# Patient Record
Sex: Female | Born: 1935 | Race: White | Hispanic: No | Smoking: Never smoker
Health system: Southern US, Community
[De-identification: ages and names within clinical notes are randomized; demographics above are authoritative.]

## PROBLEM LIST (undated history)

## (undated) ENCOUNTER — Emergency Department (HOSPITAL_COMMUNITY): Disposition: A | Discharge status: Home / Self Care | Payer: Medicare Other

## (undated) DIAGNOSIS — M4800 Spinal stenosis, site unspecified: Secondary | ICD-10-CM

## (undated) DIAGNOSIS — B029 Zoster without complications: Secondary | ICD-10-CM

## (undated) DIAGNOSIS — F329 Major depressive disorder, single episode, unspecified: Secondary | ICD-10-CM

## (undated) DIAGNOSIS — H9190 Unspecified hearing loss, unspecified ear: Secondary | ICD-10-CM

## (undated) DIAGNOSIS — IMO0001 Reserved for inherently not codable concepts without codable children: Secondary | ICD-10-CM

## (undated) DIAGNOSIS — D649 Anemia, unspecified: Secondary | ICD-10-CM

## (undated) DIAGNOSIS — D126 Benign neoplasm of colon, unspecified: Secondary | ICD-10-CM

## (undated) DIAGNOSIS — R413 Other amnesia: Secondary | ICD-10-CM

## (undated) DIAGNOSIS — M069 Rheumatoid arthritis, unspecified: Secondary | ICD-10-CM

## (undated) DIAGNOSIS — E7800 Pure hypercholesterolemia, unspecified: Secondary | ICD-10-CM

## (undated) DIAGNOSIS — M810 Age-related osteoporosis without current pathological fracture: Secondary | ICD-10-CM

## (undated) DIAGNOSIS — K5792 Diverticulitis of intestine, part unspecified, without perforation or abscess without bleeding: Secondary | ICD-10-CM

## (undated) DIAGNOSIS — I10 Essential (primary) hypertension: Secondary | ICD-10-CM

## (undated) DIAGNOSIS — F32A Depression, unspecified: Secondary | ICD-10-CM

## (undated) HISTORY — PX: VAGINAL HYSTERECTOMY: SUR661

## (undated) HISTORY — PX: ANKLE SURGERY: SHX546

## (undated) HISTORY — PX: BACK SURGERY: SHX140

## (undated) HISTORY — PX: GALLBLADDER SURGERY: SHX652

## (undated) HISTORY — PX: TONSILLECTOMY AND ADENOIDECTOMY: SHX28

---

## 1997-10-06 ENCOUNTER — Other Ambulatory Visit: Admission: RE | Admit: 1997-10-06 | Discharge: 1997-10-06 | Payer: Self-pay

## 1999-01-06 ENCOUNTER — Ambulatory Visit (HOSPITAL_COMMUNITY): Admission: RE | Admit: 1999-01-06 | Discharge: 1999-01-06 | Payer: Self-pay | Admitting: *Deleted

## 1999-02-15 ENCOUNTER — Encounter: Payer: Self-pay | Admitting: *Deleted

## 1999-02-15 ENCOUNTER — Encounter: Admission: RE | Admit: 1999-02-15 | Discharge: 1999-02-15 | Payer: Self-pay | Admitting: *Deleted

## 1999-03-11 ENCOUNTER — Encounter: Payer: Self-pay | Admitting: General Surgery

## 1999-03-11 ENCOUNTER — Encounter: Admission: RE | Admit: 1999-03-11 | Discharge: 1999-03-11 | Payer: Self-pay | Admitting: General Surgery

## 1999-03-22 ENCOUNTER — Encounter: Payer: Self-pay | Admitting: General Surgery

## 1999-03-25 ENCOUNTER — Encounter (INDEPENDENT_AMBULATORY_CARE_PROVIDER_SITE_OTHER): Payer: Self-pay | Admitting: Specialist

## 1999-03-25 ENCOUNTER — Ambulatory Visit (HOSPITAL_COMMUNITY): Admission: RE | Admit: 1999-03-25 | Discharge: 1999-03-26 | Payer: Self-pay | Admitting: General Surgery

## 1999-04-18 ENCOUNTER — Ambulatory Visit (HOSPITAL_COMMUNITY): Admission: RE | Admit: 1999-04-18 | Discharge: 1999-04-18 | Payer: Self-pay | Admitting: *Deleted

## 1999-04-18 ENCOUNTER — Encounter (INDEPENDENT_AMBULATORY_CARE_PROVIDER_SITE_OTHER): Payer: Self-pay | Admitting: *Deleted

## 1999-09-27 ENCOUNTER — Encounter: Payer: Self-pay | Admitting: Family Medicine

## 1999-09-27 ENCOUNTER — Encounter: Admission: RE | Admit: 1999-09-27 | Discharge: 1999-09-27 | Payer: Self-pay | Admitting: Family Medicine

## 2000-01-19 ENCOUNTER — Encounter: Payer: Self-pay | Admitting: Neurosurgery

## 2000-01-19 ENCOUNTER — Ambulatory Visit (HOSPITAL_COMMUNITY): Admission: RE | Admit: 2000-01-19 | Discharge: 2000-01-19 | Payer: Self-pay | Admitting: Neurosurgery

## 2000-02-02 ENCOUNTER — Encounter: Payer: Self-pay | Admitting: Neurosurgery

## 2000-02-02 ENCOUNTER — Ambulatory Visit (HOSPITAL_COMMUNITY): Admission: RE | Admit: 2000-02-02 | Discharge: 2000-02-02 | Payer: Self-pay | Admitting: Neurosurgery

## 2000-04-03 ENCOUNTER — Encounter: Payer: Self-pay | Admitting: Neurosurgery

## 2000-04-03 ENCOUNTER — Ambulatory Visit (HOSPITAL_COMMUNITY): Admission: RE | Admit: 2000-04-03 | Discharge: 2000-04-03 | Payer: Self-pay | Admitting: Neurosurgery

## 2001-03-25 ENCOUNTER — Other Ambulatory Visit: Admission: RE | Admit: 2001-03-25 | Discharge: 2001-03-25 | Payer: Self-pay | Admitting: Family Medicine

## 2002-07-03 ENCOUNTER — Ambulatory Visit (HOSPITAL_COMMUNITY): Admission: RE | Admit: 2002-07-03 | Discharge: 2002-07-03 | Payer: Self-pay | Admitting: *Deleted

## 2006-07-12 ENCOUNTER — Encounter: Admission: RE | Admit: 2006-07-12 | Discharge: 2006-07-12 | Payer: Self-pay | Admitting: Neurosurgery

## 2006-08-03 ENCOUNTER — Inpatient Hospital Stay (HOSPITAL_COMMUNITY): Admission: RE | Admit: 2006-08-03 | Discharge: 2006-08-07 | Payer: Self-pay | Admitting: Neurosurgery

## 2007-10-02 ENCOUNTER — Encounter: Admission: RE | Admit: 2007-10-02 | Discharge: 2007-10-02 | Payer: Self-pay | Admitting: Rheumatology

## 2009-02-21 ENCOUNTER — Emergency Department: Payer: Self-pay | Admitting: Internal Medicine

## 2010-07-05 NOTE — Op Note (Signed)
NAME:  Brittany Solomon, Brittany Solomon                      ACCOUNT NO.:  [id number]   MEDICAL RECORD NO.:  [id number]          PATIENT TYPE:  INP   LOCATION:  3022                         FACILITY:  [facility]   PHYSICIAN:  Hilda Lias, M.D.   DATE OF BIRTH:  January 28, 1926   DATE OF PROCEDURE:  08/03/2006  DATE OF DISCHARGE:                               OPERATIVE REPORT   PREOPERATIVE DIAGNOSIS:  Lumbar stenosis L3-L4 and L4-L5 with step off  at the level L4-L5.   POSTOPERATIVE DIAGNOSIS:  Lumbar stenosis L3-L4 and L4-L5 with step off  at the level L4-L5.   PROCEDURE:  Bilateral L3, L4, and L5 laminectomy, posterolateral  arthrodesis with autograft.   SURGEON:  Hilda Lias, M.D.   CLINICAL HISTORY:  Ms. Veale is a lady who has been seen by me for more  than a year complaining of back pain with radiation to both legs.  We  knew from the beginning that she had a severe stenosis at the level of  L4-L5 and borderline at L3-L4.  The patient wanted to avoid surgery, but  she was getting worse to the point where now she feels that she wants to  proceed with surgery.  Proceeded The surgery was fully explained to her.   DESCRIPTION OF PROCEDURE:  The patient was taken to the OR and she was  positioned in a prone manner.  The back was cleaned with DuraPrep.  A  midline incision from L3 to L5-S1 was made and muscles were retracted  all the way laterally until we saw the beginning of the transverse  process.  From then, on with the Leksell, we removed the spinous process  of L5, L4, and L3, and with the drill, we did a bilateral laminectomy.  The patient had a thick yellow ligament at the level L3-L4 but worse at  the level of L4-L5.  Decompression of the canal was achieved.  Then,  foraminotomy to decompress the L3, L4, L5, and S1 nerve roots was done.  Having a good decompression, the area was irrigated.  We went laterally  and we drilled the lateral aspect of the facet as well as the beginning  of the transverse  process.  From then on, autograft was used for  arthrodesis.  Having good hemostasis, the area was closed with Vicryl  and Steri-Strips.           ______________________________  Hilda Lias, M.D.     EB/MEDQ  D:  08/03/2006  T:  08/04/2006  Job:  [id number]

## 2010-07-05 NOTE — Discharge Summary (Signed)
Brittany Solomon, Brittany Solomon                   ACCOUNT NO.:  [id number]   MEDICAL RECORD NO.:  [id number]          PATIENT TYPE:  INP   LOCATION:  3022                         FACILITY:  [facility]   PHYSICIAN:  Hilda Lias, M.D.   DATE OF BIRTH:  1925-11-23   DATE OF ADMISSION:  08/03/2006  DATE OF DISCHARGE:                               DISCHARGE SUMMARY   ADMISSION DIAGNOSIS:  Lumbar stenosis with neurogenic pelvic osteopenia.   FINAL DIAGNOSIS:  Lumbar stenosis with neurogenic pelvic osteopenia.   CLINICAL HISTORY:  The patient was admitted because of back pain,  radiation to both legs.  X-rays showed that she has severe stenosis at  the level 3-4, 4-5.  I have been following this lady for more than a  year and finally, she decided to go with surgery because she was unable  to work.   LABORATORY:  Normal.   COURSE IN THE HOSPITAL:  The patient was taken to surgery and  had  decompressive laminectomy L3 to L5-S1 with posterolateral fusion was  done.  Today, she is doing much better.  She is ambulating.  She feels  that the pain in her legs is almost gone.  She is going to be followed  by me in my office.   CONDITION ON DISCHARGE:  Improvement.   MEDICATIONS:  Percocet, diazepam.  She is to continue taking  preadmission medications.   DIET:  Regular.   ACTIVITY:  Not to drive for at least 2 weeks.   FOLLOW UP:  She will be calling the office to see me in 4 weeks.   The patient is going to be in her own house with her sister, who is  going to help her.           ______________________________  Hilda Lias, M.D.     EB/MEDQ  D:  08/07/2006  T:  08/07/2006  Job:  [id number]

## 2010-07-05 NOTE — H&P (Signed)
Brittany Solomon, Brittany Solomon                   ACCOUNT NO.:  [id number]   MEDICAL RECORD NO.:  [id number]          PATIENT TYPE:  INP   LOCATION:  2899                         FACILITY:  [facility]   PHYSICIAN:  Hilda Lias, M.D.   DATE OF BIRTH:  August 08, 1925   DATE OF ADMISSION:  08/03/2006  DATE OF DISCHARGE:                              HISTORY & PHYSICAL   HISTORY OF PRESENT ILLNESS:  Ms. Hitz is a lady whom I have been seeing  in my office for more than two years complaining of back pain, radiation  to both legs, weakness, worse with walking.  In 2006 we made the  diagnosis of a lumbar stenosis.  Nevertheless, she wanted to stay away  from surgery.  But now she is getting worse.  She tells me that her  walking is getting to the point that she is quite miserable.  Lumbar  spine x-ray showed that she has stenosis at the level of L3-L4 and L4-L5  with step-off between 4 and 5 of about 2 mm.  The patient will be  admitted for surgery.   PAST MEDICAL HISTORY:  Tonsillectomy; hysterectomy; cholecystectomy.   REVIEW OF SYSTEMS:  Review of systems is positive for back and leg pain.   ALLERGIES:  She is not allergic to any medications.   FAMILY HISTORY:  Unremarkable.   PHYSICAL EXAMINATION:  HEAD, EYES, EARS, NOSE AND THROAT:  Normal.  NECK:  Normal.  LUNGS:  Clear.  CARDIOVASCULAR:  Heart sounds are normal.  ABDOMEN:  Normal.  EXTREMITIES:  Normal pulses.  NEUROLOGIC:  Shows weakness in dorsiflexion in both feet.  She has  straight leg raising which is positive at about 60 degrees.   RADIOLOGIC FINDINGS:  The lumbar spine x-ray shows severe stenosis at L4-  L5 and borderline between L3 and L4.   CLINICAL IMPRESSION:  Lumbar stenosis with neurogenic claudication.   RECOMMENDATIONS:  The patient is being admitted for surgery.  The  procedure will be an L4-L5 laminectomy.  We are going to make a decision  about L3 during surgery.  We are going to use her own bone for fusion  posterolaterally.  She knows about the risks such as infection, CSF  leak, worsening pain, paralysis, need for further surgery.          ______________________________  Hilda Lias, M.D.    EB/MEDQ  D:  08/03/2006  T:  08/03/2006  Job:  [id number]

## 2010-07-08 NOTE — Op Note (Signed)
[place]. [facility]  Patient:    Brittany Solomon, Brittany Solomon                          MRN: [id number] Proc. Date: 04/18/99 Adm. Date:  [id number] Attending:  Mingo Amber CC:         Angelia Mould. Derrell Lolling, M.D.             Burnell Blanks, M.D.                           Operative Report  PROCEDURE:  Video colonoscopy.  INDICATIONS:  A 77 year old female with recurrent left upper quadrant pain of unclear etiology.  Worked up to date including sigmoidoscopy, upper endoscopy, nd CT scan have been unhelpful.  PREPARATION:  She is NPO since midnight having taken phosphasoda prep and a clear liquid diet.  The mucosa throughout is clean.  DEPTH OF INSERTION:  Terminal ileum.  PREPROCEDURE SEDATION:  She received a total of 80 mg of Demerol and 8 mg of Versed intravenously.  In addition, she was on 2 liters of nasal cannula O2.  DESCRIPTION OF PROCEDURE:  The Olympus video colonoscope was inserted via the rectum and advanced through a somewhat tortuous sigmoid colon to the splenic flexure.  From this point onward intubation to the cecum and thence into the ileum was technically easier.  Cecal landmarks were identified and photographed.  The  ileocecal valve was penetrated and then the terminal ileum appeared normal.  On  withdrawal, the mucosa was carefully evaluated and note was made of a dimminutive polyp in the ascending colon that was removed with hot biopsy forceps and a second similar dimminutive polyp at the splenic flexure similarly removed.  There was o postpolypectomy bleeding.  No other abnormalities were noted throughout the colon. There was no area of inflammation, neoplasia, diverticulosis, or other problems. Retroflexed view of the rectum did not demonstrate any significant hemorrhoids.  The patient tolerated the procedure well.  Pulse, blood pressure, and oximetry testing were stable throughout.  She was observed in the recovery room for  45 minutes and discharged home alert with a benign abdomen.  IMPRESSION:  Near normal colonoscopy with two dimminutive polyps which have been removed and which could not have accounted for her discomfort.  I continue to believe this pain is functional or neuropathic and think that she probably should be evaluated by an orthopedist or neurologist to see if she is having pain radiating from nerve impingement.  The patient will receive advising her of the polyp histology and any necessary follow-up.  She is to return to the care of Burnell Blanks, M.D. DD:  04/18/99 TD:  04/18/99 Job: [id number]

## 2010-07-08 NOTE — Op Note (Signed)
[place]. [facility]  Patient:    Brittany Solomon                           MRN: [id number] Proc. Date: 03/25/99 Adm. Date:  [id number] Disc. Date: [id number] Attending:  Brandy Hale CC:         Roosvelt Harps, M.D.             Burnell Blanks, M.D.                           Operative Report  PREOPERATIVE DIAGNOSIS:  Gallstones.  POSTOPERATIVE DIAGNOSIS:  Gallstones.  OPERATION:  Laparoscopic cholecystectomy.  SURGEON:  Angelia Mould. Derrell Lolling, M.D.  ASSISTANT:  Currie Paris, M.D.  INDICATION:  This is a 77 year old white female who has had left upper quadrant  pain for a few months despite the use of a proton pump inhibitor.  She had a past history of peptic ulcer disease but a recent upper endoscopy was essentially normal.  Sigmoidoscopy to 70 cm was normal.  Barium enema was essentially unremarkable except for mild sigmoid diverticulosis.  A CT scan of the abdomen showed multiple small calcified gallstones.  Otherwise  unremarkable CT scan of the abdomen and pelvis.  A CBC, liver function test, and renal function tests are normal.  Her urinalysis is unremarkable.  She was advised that she has gallstones and that it would be in her best interest to have her gallbladder out at some point and time.  It is uncertain whether her left upper quadrant is in any way related to her gallstones.  She wanted to go ahead and have a diagnostic laparoscopy and laparoscopic cholecystectomy and is  brought to the operating room electively for that surgery.  FINDINGS:  The gallbladder was chronically inflamed and somewhat thick walled. The liver appeared healthy both right and left lobes.  We got a good look on the top and the undersurface of the left lobe of the liver.  We examined the stomach quite carefully, anterior wall, posterior wall, and the duodenum as well.  They looked normal.  The splenic flexure of the colon looked normal but it was  quite high extending all the way up to the diaphragm.  We saw no abnormality of the omentum. We saw no significant adhesions and no infarcted fat.  No inflammation, no fat necrosis and the peritoneal surfaces looked normal.  There was no ascites. Small and large bowel were grossly normal to inspection.  The anatomy of the cystic duct and cystic artery were conventional.  DESCRIPTION OF PROCEDURE:  Following the induction of general endotracheal anesthesia, the patients abdomen was prepped and draped in a sterile fashion. Then 0.5% Marcaine with epinephrine was used as a local infiltration anesthetic.  A vertical incision was made at the lower rim of the umbilicus.  The fascia was  incised in the midline and the abdominal cavity entered under direct vision.  A 10 mm Hasson trocar was inserted and secured with a purse-string suture of 0 Vicryl. Pneumoperitoneum was created.  Video camera was inserted with visualization and  findings as described above.  A 10 mm trocar was placed in the subxiphoid region and two 5 mm trocars were placed in the right mid-abdomen.  Exploration of the upper abdomen and left upper quadrant was carried out with negative findings as described above.  Gallbladder was placed on traction.  A  few adhesions were taken down.  We dissected out the cystic duct and cystic artery. We clearly identified the junction of the cystic duct with the infundibulum of the  gallbladder and clearly identified the region of the common bile duct.  The cystic duct was then secured with metal clips and divided.  We dissected out the cystic artery and it was secured with metal clips and divided.  The gallbladder was dissected from its liver bed with electrocautery and that dissection was uneventful.  The gallbladder was placed in an grasper and removed through the umbilical port.  The operative field was copiously irrigated.  There was no bleeding and no bile  leak  whatsoever.  The trocars were removed under direct vision and there was no  bleeding from the trocar sites.  Pneumoperitoneum was released.  Fascia at the umbilicus was closed with 0 Vicryl sutures, skin incisions were closed with subcuticular sutures of 4-0, and Steri-Strips.  Clean bandages were placed and he patient taken to the recovery room in stable condition.  ESTIMATED BLOOD LOSS:  10 cc.  COMPLICATIONS:  None.  SPONGE/NEEDLE/INSTRUMENT COUNTS:  Correct. DD:  03/25/99 TD:  03/26/99 Job: [id number]

## 2010-12-08 LAB — CBC
Hemoglobin: 11.2 — ABNORMAL LOW
MCHC: 33.8
RBC: 3.99
WBC: 7

## 2010-12-08 LAB — BASIC METABOLIC PANEL
CO2: 27
Calcium: 9.6
Creatinine, Ser: 0.58
GFR calc Af Amer: >60
GFR calc non Af Amer: >60
Sodium: 134 — ABNORMAL LOW

## 2010-12-08 LAB — TYPE AND SCREEN
ABO/RH(D): A NEG
Antibody Screen: NEGATIVE

## 2010-12-08 LAB — ABO/RH: ABO/RH(D): A NEG

## 2011-02-22 DIAGNOSIS — E7800 Pure hypercholesterolemia, unspecified: Secondary | ICD-10-CM | POA: Diagnosis not present

## 2011-02-22 DIAGNOSIS — I10 Essential (primary) hypertension: Secondary | ICD-10-CM | POA: Diagnosis not present

## 2011-02-22 DIAGNOSIS — D509 Iron deficiency anemia, unspecified: Secondary | ICD-10-CM | POA: Diagnosis not present

## 2011-02-22 DIAGNOSIS — F329 Major depressive disorder, single episode, unspecified: Secondary | ICD-10-CM | POA: Diagnosis not present

## 2011-03-20 DIAGNOSIS — H4011X0 Primary open-angle glaucoma, stage unspecified: Secondary | ICD-10-CM | POA: Diagnosis not present

## 2011-04-10 DIAGNOSIS — H353290 Exudative age-related macular degeneration, unspecified eye, stage unspecified: Secondary | ICD-10-CM | POA: Diagnosis not present

## 2011-04-10 DIAGNOSIS — H26499 Other secondary cataract, unspecified eye: Secondary | ICD-10-CM | POA: Diagnosis not present

## 2011-04-19 DIAGNOSIS — Z79899 Other long term (current) drug therapy: Secondary | ICD-10-CM | POA: Diagnosis not present

## 2011-05-08 DIAGNOSIS — H353290 Exudative age-related macular degeneration, unspecified eye, stage unspecified: Secondary | ICD-10-CM | POA: Diagnosis not present

## 2011-05-08 DIAGNOSIS — H26499 Other secondary cataract, unspecified eye: Secondary | ICD-10-CM | POA: Diagnosis not present

## 2011-06-01 DIAGNOSIS — M25549 Pain in joints of unspecified hand: Secondary | ICD-10-CM | POA: Diagnosis not present

## 2011-06-01 DIAGNOSIS — Z79899 Other long term (current) drug therapy: Secondary | ICD-10-CM | POA: Diagnosis not present

## 2011-06-01 DIAGNOSIS — M069 Rheumatoid arthritis, unspecified: Secondary | ICD-10-CM | POA: Diagnosis not present

## 2011-06-01 DIAGNOSIS — M545 Low back pain: Secondary | ICD-10-CM | POA: Diagnosis not present

## 2011-06-05 DIAGNOSIS — H26499 Other secondary cataract, unspecified eye: Secondary | ICD-10-CM | POA: Diagnosis not present

## 2011-06-05 DIAGNOSIS — H353290 Exudative age-related macular degeneration, unspecified eye, stage unspecified: Secondary | ICD-10-CM | POA: Diagnosis not present

## 2011-06-05 DIAGNOSIS — Z09 Encounter for follow-up examination after completed treatment for conditions other than malignant neoplasm: Secondary | ICD-10-CM | POA: Diagnosis not present

## 2011-07-27 DIAGNOSIS — Z79899 Other long term (current) drug therapy: Secondary | ICD-10-CM | POA: Diagnosis not present

## 2011-07-31 DIAGNOSIS — H26499 Other secondary cataract, unspecified eye: Secondary | ICD-10-CM | POA: Diagnosis not present

## 2011-07-31 DIAGNOSIS — H353290 Exudative age-related macular degeneration, unspecified eye, stage unspecified: Secondary | ICD-10-CM | POA: Diagnosis not present

## 2011-07-31 DIAGNOSIS — Z09 Encounter for follow-up examination after completed treatment for conditions other than malignant neoplasm: Secondary | ICD-10-CM | POA: Diagnosis not present

## 2011-08-28 DIAGNOSIS — Z79899 Other long term (current) drug therapy: Secondary | ICD-10-CM | POA: Diagnosis not present

## 2011-08-28 DIAGNOSIS — E7800 Pure hypercholesterolemia, unspecified: Secondary | ICD-10-CM | POA: Diagnosis not present

## 2011-08-28 DIAGNOSIS — D649 Anemia, unspecified: Secondary | ICD-10-CM | POA: Diagnosis not present

## 2011-09-01 DIAGNOSIS — E7800 Pure hypercholesterolemia, unspecified: Secondary | ICD-10-CM | POA: Diagnosis not present

## 2011-09-01 DIAGNOSIS — I10 Essential (primary) hypertension: Secondary | ICD-10-CM | POA: Diagnosis not present

## 2011-09-01 DIAGNOSIS — D509 Iron deficiency anemia, unspecified: Secondary | ICD-10-CM | POA: Diagnosis not present

## 2011-09-01 DIAGNOSIS — Z6825 Body mass index (BMI) 25.0-25.9, adult: Secondary | ICD-10-CM | POA: Diagnosis not present

## 2011-09-18 DIAGNOSIS — H40029 Open angle with borderline findings, high risk, unspecified eye: Secondary | ICD-10-CM | POA: Diagnosis not present

## 2011-09-18 DIAGNOSIS — H5200 Hypermetropia, unspecified eye: Secondary | ICD-10-CM | POA: Diagnosis not present

## 2011-09-25 DIAGNOSIS — Z79899 Other long term (current) drug therapy: Secondary | ICD-10-CM | POA: Diagnosis not present

## 2011-10-02 DIAGNOSIS — M810 Age-related osteoporosis without current pathological fracture: Secondary | ICD-10-CM | POA: Diagnosis not present

## 2011-10-02 DIAGNOSIS — M069 Rheumatoid arthritis, unspecified: Secondary | ICD-10-CM | POA: Diagnosis not present

## 2011-10-02 DIAGNOSIS — M25579 Pain in unspecified ankle and joints of unspecified foot: Secondary | ICD-10-CM | POA: Diagnosis not present

## 2011-10-27 DIAGNOSIS — Z79899 Other long term (current) drug therapy: Secondary | ICD-10-CM | POA: Diagnosis not present

## 2011-11-20 DIAGNOSIS — H26499 Other secondary cataract, unspecified eye: Secondary | ICD-10-CM | POA: Diagnosis not present

## 2011-11-20 DIAGNOSIS — H353290 Exudative age-related macular degeneration, unspecified eye, stage unspecified: Secondary | ICD-10-CM | POA: Diagnosis not present

## 2011-12-29 DIAGNOSIS — Z79899 Other long term (current) drug therapy: Secondary | ICD-10-CM | POA: Diagnosis not present

## 2012-01-15 DIAGNOSIS — H353290 Exudative age-related macular degeneration, unspecified eye, stage unspecified: Secondary | ICD-10-CM | POA: Diagnosis not present

## 2012-02-05 DIAGNOSIS — M542 Cervicalgia: Secondary | ICD-10-CM | POA: Diagnosis not present

## 2012-02-05 DIAGNOSIS — M069 Rheumatoid arthritis, unspecified: Secondary | ICD-10-CM | POA: Diagnosis not present

## 2012-02-05 DIAGNOSIS — M25549 Pain in joints of unspecified hand: Secondary | ICD-10-CM | POA: Diagnosis not present

## 2012-02-05 DIAGNOSIS — M25579 Pain in unspecified ankle and joints of unspecified foot: Secondary | ICD-10-CM | POA: Diagnosis not present

## 2012-02-23 DIAGNOSIS — E559 Vitamin D deficiency, unspecified: Secondary | ICD-10-CM | POA: Diagnosis not present

## 2012-02-23 DIAGNOSIS — Z111 Encounter for screening for respiratory tuberculosis: Secondary | ICD-10-CM | POA: Diagnosis not present

## 2012-02-23 DIAGNOSIS — M2550 Pain in unspecified joint: Secondary | ICD-10-CM | POA: Diagnosis not present

## 2012-02-23 DIAGNOSIS — R5381 Other malaise: Secondary | ICD-10-CM | POA: Diagnosis not present

## 2012-03-04 DIAGNOSIS — Z23 Encounter for immunization: Secondary | ICD-10-CM | POA: Diagnosis not present

## 2012-03-07 DIAGNOSIS — Z79899 Other long term (current) drug therapy: Secondary | ICD-10-CM | POA: Diagnosis not present

## 2012-03-07 DIAGNOSIS — E7800 Pure hypercholesterolemia, unspecified: Secondary | ICD-10-CM | POA: Diagnosis not present

## 2012-03-11 DIAGNOSIS — H353290 Exudative age-related macular degeneration, unspecified eye, stage unspecified: Secondary | ICD-10-CM | POA: Diagnosis not present

## 2012-03-11 DIAGNOSIS — H26499 Other secondary cataract, unspecified eye: Secondary | ICD-10-CM | POA: Diagnosis not present

## 2012-03-12 DIAGNOSIS — F329 Major depressive disorder, single episode, unspecified: Secondary | ICD-10-CM | POA: Diagnosis not present

## 2012-03-12 DIAGNOSIS — D509 Iron deficiency anemia, unspecified: Secondary | ICD-10-CM | POA: Diagnosis not present

## 2012-03-12 DIAGNOSIS — E7800 Pure hypercholesterolemia, unspecified: Secondary | ICD-10-CM | POA: Diagnosis not present

## 2012-03-12 DIAGNOSIS — I10 Essential (primary) hypertension: Secondary | ICD-10-CM | POA: Diagnosis not present

## 2012-03-25 DIAGNOSIS — H4011X0 Primary open-angle glaucoma, stage unspecified: Secondary | ICD-10-CM | POA: Diagnosis not present

## 2012-04-08 DIAGNOSIS — M25519 Pain in unspecified shoulder: Secondary | ICD-10-CM | POA: Diagnosis not present

## 2012-04-08 DIAGNOSIS — Z09 Encounter for follow-up examination after completed treatment for conditions other than malignant neoplasm: Secondary | ICD-10-CM | POA: Diagnosis not present

## 2012-04-08 DIAGNOSIS — M25549 Pain in joints of unspecified hand: Secondary | ICD-10-CM | POA: Diagnosis not present

## 2012-04-08 DIAGNOSIS — Z79899 Other long term (current) drug therapy: Secondary | ICD-10-CM | POA: Diagnosis not present

## 2012-04-08 DIAGNOSIS — M069 Rheumatoid arthritis, unspecified: Secondary | ICD-10-CM | POA: Diagnosis not present

## 2012-04-29 DIAGNOSIS — R413 Other amnesia: Secondary | ICD-10-CM | POA: Diagnosis not present

## 2012-04-29 DIAGNOSIS — R269 Unspecified abnormalities of gait and mobility: Secondary | ICD-10-CM | POA: Diagnosis not present

## 2012-05-13 DIAGNOSIS — R413 Other amnesia: Secondary | ICD-10-CM | POA: Diagnosis not present

## 2012-05-13 DIAGNOSIS — R269 Unspecified abnormalities of gait and mobility: Secondary | ICD-10-CM | POA: Diagnosis not present

## 2012-05-14 ENCOUNTER — Other Ambulatory Visit (INDEPENDENT_AMBULATORY_CARE_PROVIDER_SITE_OTHER): Payer: Medicare Other | Admitting: Diagnostic Neuroimaging

## 2012-05-14 DIAGNOSIS — R413 Other amnesia: Secondary | ICD-10-CM

## 2012-05-14 NOTE — Procedures (Signed)
[facility]  NEUROIMAGING REPORT   STUDY DATE: 05/13/12 PATIENT NAME: Brittany Solomon DOB: Jun 15, 1925 MRN: [id number]  ORDERING CLINICIAN: Joycelyn Schmid, MD  CLINICAL HISTORY: 79 year old female with memory loss.  EXAM: MRI brain (without)  TECHNIQUE: MRI of the brain without contrast was obtained utilizing 5 mm axial slices with T1, T2, T2 flair, T2 star gradient echo and diffusion weighted views.  T1 sagittal and T2 coronal views were obtained. CONTRAST: no IMAGING SITE: [facility]   FINDINGS:  No abnormal lesions are seen on diffusion-weighted views to suggest acute ischemia. The cortical sulci, fissures and cisterns are notable for mild biparietal, moderate perisylivan, and severe mesial temporal atrophy. Lateral, third and fourth ventricle are normal in size and appearance. No extra-axial fluid collections are seen. No evidence of mass effect or midline shift. Moderate periventricular and subcortical chronic small vessel ischemic disease.  On sagittal views the posterior fossa, pituitary gland and corpus callosum are notable for moderate corpus callosum atrophy. No evidence of intracranial hemorrhage on gradient-echo views. The orbits and their contents, paranasal sinuses and calvarium are notable for post-surgical orbits.  Intracranial flow voids are present.  IMPRESSION:  Abnormal MRI brain (without) demonstrating: 1. Mild biparietal, moderate perisylivan, moderate corpus callosum and severe mesial temporal atrophy. 2. Moderate periventricular and subcortical chronic small vessel ischemic disease.   INTERPRETING PHYSICIAN:  Suanne Marker, MD Certified in Neurology, Neurophysiology and Neuroimaging  [facility] [address] [place], [place] [address] [phone]

## 2012-05-17 DIAGNOSIS — H4011X0 Primary open-angle glaucoma, stage unspecified: Secondary | ICD-10-CM | POA: Diagnosis not present

## 2012-05-29 DIAGNOSIS — Z79899 Other long term (current) drug therapy: Secondary | ICD-10-CM | POA: Diagnosis not present

## 2012-06-03 ENCOUNTER — Telehealth: Payer: Self-pay | Admitting: Diagnostic Neuroimaging

## 2012-06-03 DIAGNOSIS — H353290 Exudative age-related macular degeneration, unspecified eye, stage unspecified: Secondary | ICD-10-CM | POA: Diagnosis not present

## 2012-06-03 DIAGNOSIS — H3560 Retinal hemorrhage, unspecified eye: Secondary | ICD-10-CM | POA: Diagnosis not present

## 2012-06-03 DIAGNOSIS — H35359 Cystoid macular degeneration, unspecified eye: Secondary | ICD-10-CM | POA: Diagnosis not present

## 2012-06-03 DIAGNOSIS — H35059 Retinal neovascularization, unspecified, unspecified eye: Secondary | ICD-10-CM | POA: Diagnosis not present

## 2012-06-03 NOTE — Telephone Encounter (Signed)
Done

## 2012-06-11 DIAGNOSIS — M19049 Primary osteoarthritis, unspecified hand: Secondary | ICD-10-CM | POA: Diagnosis not present

## 2012-06-11 DIAGNOSIS — M069 Rheumatoid arthritis, unspecified: Secondary | ICD-10-CM | POA: Diagnosis not present

## 2012-06-11 DIAGNOSIS — M810 Age-related osteoporosis without current pathological fracture: Secondary | ICD-10-CM | POA: Diagnosis not present

## 2012-07-09 DIAGNOSIS — H353190 Nonexudative age-related macular degeneration, unspecified eye, stage unspecified: Secondary | ICD-10-CM | POA: Diagnosis not present

## 2012-07-09 DIAGNOSIS — H353290 Exudative age-related macular degeneration, unspecified eye, stage unspecified: Secondary | ICD-10-CM | POA: Diagnosis not present

## 2012-07-09 DIAGNOSIS — H35059 Retinal neovascularization, unspecified, unspecified eye: Secondary | ICD-10-CM | POA: Diagnosis not present

## 2012-07-29 DIAGNOSIS — B029 Zoster without complications: Secondary | ICD-10-CM | POA: Diagnosis not present

## 2012-07-29 DIAGNOSIS — IMO0002 Reserved for concepts with insufficient information to code with codable children: Secondary | ICD-10-CM | POA: Diagnosis not present

## 2012-07-29 DIAGNOSIS — Z79899 Other long term (current) drug therapy: Secondary | ICD-10-CM | POA: Diagnosis not present

## 2012-07-29 DIAGNOSIS — Z9181 History of falling: Secondary | ICD-10-CM | POA: Diagnosis not present

## 2012-08-13 DIAGNOSIS — R079 Chest pain, unspecified: Secondary | ICD-10-CM | POA: Diagnosis not present

## 2012-08-13 DIAGNOSIS — T1490XA Injury, unspecified, initial encounter: Secondary | ICD-10-CM | POA: Diagnosis not present

## 2012-08-14 ENCOUNTER — Encounter (HOSPITAL_COMMUNITY): Payer: Self-pay | Admitting: Emergency Medicine

## 2012-08-14 ENCOUNTER — Emergency Department (HOSPITAL_COMMUNITY): Payer: Medicare Other

## 2012-08-14 ENCOUNTER — Emergency Department (HOSPITAL_COMMUNITY)
Admission: EM | Admit: 2012-08-14 | Discharge: 2012-08-14 | Disposition: A | Discharge status: Home / Self Care | Payer: Medicare Other | Attending: Emergency Medicine | Admitting: Emergency Medicine

## 2012-08-14 DIAGNOSIS — R079 Chest pain, unspecified: Secondary | ICD-10-CM | POA: Diagnosis not present

## 2012-08-14 DIAGNOSIS — S20219A Contusion of unspecified front wall of thorax, initial encounter: Secondary | ICD-10-CM | POA: Diagnosis not present

## 2012-08-14 DIAGNOSIS — I10 Essential (primary) hypertension: Secondary | ICD-10-CM | POA: Insufficient documentation

## 2012-08-14 DIAGNOSIS — Z862 Personal history of diseases of the blood and blood-forming organs and certain disorders involving the immune mechanism: Secondary | ICD-10-CM | POA: Diagnosis not present

## 2012-08-14 DIAGNOSIS — S239XXA Sprain of unspecified parts of thorax, initial encounter: Secondary | ICD-10-CM | POA: Diagnosis not present

## 2012-08-14 DIAGNOSIS — Y92009 Unspecified place in unspecified non-institutional (private) residence as the place of occurrence of the external cause: Secondary | ICD-10-CM | POA: Insufficient documentation

## 2012-08-14 DIAGNOSIS — Y939 Activity, unspecified: Secondary | ICD-10-CM | POA: Insufficient documentation

## 2012-08-14 DIAGNOSIS — IMO0002 Reserved for concepts with insufficient information to code with codable children: Secondary | ICD-10-CM | POA: Insufficient documentation

## 2012-08-14 DIAGNOSIS — S39012A Strain of muscle, fascia and tendon of lower back, initial encounter: Secondary | ICD-10-CM

## 2012-08-14 DIAGNOSIS — F0390 Unspecified dementia without behavioral disturbance: Secondary | ICD-10-CM | POA: Insufficient documentation

## 2012-08-14 DIAGNOSIS — Z7982 Long term (current) use of aspirin: Secondary | ICD-10-CM | POA: Diagnosis not present

## 2012-08-14 DIAGNOSIS — Z79899 Other long term (current) drug therapy: Secondary | ICD-10-CM | POA: Diagnosis not present

## 2012-08-14 DIAGNOSIS — F329 Major depressive disorder, single episode, unspecified: Secondary | ICD-10-CM | POA: Insufficient documentation

## 2012-08-14 DIAGNOSIS — W19XXXA Unspecified fall, initial encounter: Secondary | ICD-10-CM

## 2012-08-14 DIAGNOSIS — Z8739 Personal history of other diseases of the musculoskeletal system and connective tissue: Secondary | ICD-10-CM | POA: Insufficient documentation

## 2012-08-14 DIAGNOSIS — Z8639 Personal history of other endocrine, nutritional and metabolic disease: Secondary | ICD-10-CM | POA: Insufficient documentation

## 2012-08-14 DIAGNOSIS — S298XXA Other specified injuries of thorax, initial encounter: Secondary | ICD-10-CM | POA: Diagnosis not present

## 2012-08-14 DIAGNOSIS — W050XXA Fall from non-moving wheelchair, initial encounter: Secondary | ICD-10-CM | POA: Insufficient documentation

## 2012-08-14 DIAGNOSIS — S22009A Unspecified fracture of unspecified thoracic vertebra, initial encounter for closed fracture: Secondary | ICD-10-CM | POA: Diagnosis not present

## 2012-08-14 DIAGNOSIS — F3289 Other specified depressive episodes: Secondary | ICD-10-CM | POA: Insufficient documentation

## 2012-08-14 HISTORY — DX: Rheumatoid arthritis, unspecified: M06.9

## 2012-08-14 HISTORY — DX: Age-related osteoporosis without current pathological fracture: M81.0

## 2012-08-14 HISTORY — DX: Diverticulitis of intestine, part unspecified, without perforation or abscess without bleeding: K57.92

## 2012-08-14 HISTORY — DX: Pure hypercholesterolemia, unspecified: E78.00

## 2012-08-14 HISTORY — DX: Unspecified hearing loss, unspecified ear: H91.90

## 2012-08-14 HISTORY — DX: Other amnesia: R41.3

## 2012-08-14 HISTORY — DX: Reserved for inherently not codable concepts without codable children: IMO0001

## 2012-08-14 HISTORY — DX: Essential (primary) hypertension: I10

## 2012-08-14 HISTORY — DX: Anemia, unspecified: D64.9

## 2012-08-14 HISTORY — DX: Depression, unspecified: F32.A

## 2012-08-14 HISTORY — DX: Spinal stenosis, site unspecified: M48.00

## 2012-08-14 HISTORY — DX: Benign neoplasm of colon, unspecified: D12.6

## 2012-08-14 HISTORY — DX: Major depressive disorder, single episode, unspecified: F32.9

## 2012-08-14 HISTORY — DX: Zoster without complications: B02.9

## 2012-08-14 MED ORDER — ACETAMINOPHEN 325 MG PO TABS
650.0000 mg | ORAL_TABLET | Freq: Once | ORAL | Status: AC
  Administered 2012-08-14: 650 mg via ORAL
  Filled 2012-08-14: qty 2

## 2012-08-14 NOTE — ED Notes (Addendum)
Pt reports she accidentally slid out bed about 1 hour ago. Pt hit chest on night stand during the fall. Pt has contusion and is c/o pain to the L breast. Vitals Stable during transport. Pt Ax4, NAD. Pt denies LOC.

## 2012-08-15 DIAGNOSIS — H353290 Exudative age-related macular degeneration, unspecified eye, stage unspecified: Secondary | ICD-10-CM | POA: Diagnosis not present

## 2012-08-15 DIAGNOSIS — H35059 Retinal neovascularization, unspecified, unspecified eye: Secondary | ICD-10-CM | POA: Diagnosis not present

## 2012-08-15 NOTE — ED Provider Notes (Signed)
History    CSN: [id number] Arrival date & time 08/14/12  0028  First MD Initiated Contact with Patient 08/14/12 0136     Chief Complaint  Patient presents with  . Fall   (Consider location/radiation/quality/duration/timing/severity/associated sxs/prior Treatment) HPI The patient is an elderly woman with multiple chronic medical problems including dementia. She reportedly fell forward out of her wheelchair and landed against a wall.  She presents with complaints of chest wall pain. She denies shortness of breath. Her pain is mild and nonradiating. She also notes some low back pain. She denies any radiation of pain., No paresthesias or weakness. She denies head trauma and loss of consciousness patient is not anticoagulated. Past Medical History  Diagnosis Date  . Shingles   . Memory loss   . Osteoporosis   . Spinal stenosis   . Rheumatoid arthritis   . Diverticulitis   . Hypertension   . Hearing impairment   . Anemia   . Hypercholesteremia   . Benign neoplasm of colon   . Depression    History reviewed. No pertinent past surgical history. No family history on file. History  Substance Use Topics  . Smoking status: Not on file  . Smokeless tobacco: Not on file  . Alcohol Use: Not on file   OB History   Grav Para Term Preterm Abortions TAB SAB Ect Mult Living                 Review of Systems Gen: no weight loss, fevers, chills, night sweats Eyes: no discharge or drainage, no occular pain or visual changes Nose: no epistaxis or rhinorrhea Mouth: no dental pain, no sore throat Neck: no neck pain Lungs: no SOB, cough, wheezing CV: no chest pain, palpitations, dependent edema or orthopnea Abd: no abdominal pain, nausea, vomiting GU: no dysuria or gross hematuria MSK: As per history of present illness, otherwise Neuro: no headache, no focal neurologic deficits, chronic gait instability Skin: no rash Psyche: negative.  Allergies  Review of patient's allergies  indicates no known allergies.  Home Medications   Current Outpatient Rx  Name  Route  Sig  Dispense  Refill  . aspirin EC 81 MG tablet   Oral   Take 81 mg by mouth daily.         Marland Kitchen donepezil (ARICEPT) 5 MG tablet   Oral   Take 5 mg by mouth at bedtime.         Marland Kitchen esomeprazole (NEXIUM) 40 MG capsule   Oral   Take 40 mg by mouth daily before breakfast.         . ferrous sulfate 325 (65 FE) MG tablet   Oral   Take 325 mg by mouth 2 (two) times daily.         Marland Kitchen FLUoxetine (PROZAC) 20 MG capsule   Oral   Take 20 mg by mouth daily.         . folic acid (FOLVITE) 1 MG tablet   Oral   Take 1 mg by mouth daily.         . hydrochlorothiazide (HYDRODIURIL) 25 MG tablet   Oral   Take 25 mg by mouth daily.         . Memantine HCl ER (NAMENDA XR) 28 MG CP24   Oral   Take 1 capsule by mouth daily.         . methotrexate (RHEUMATREX) 2.5 MG tablet   Oral   Take 17.5 mg by mouth once a week.  Caution:Chemotherapy. Protect from light.         . timolol (TIMOPTIC) 0.5 % ophthalmic solution   Both Eyes   Place 1 drop into both eyes at bedtime.          BP 146/76  Pulse 80  Temp(Src) 98.2 F (36.8 C) (Oral)  Resp 18  SpO2 96% Physical Exam Gen: well developed and well nourished appearing Head: NCAT Eyes: PERL, EOMI Nose: no epistaixis or rhinorrhea Mouth/throat: mucosa is moist and pink Neck: supple, no stridor, no c spine ttp Lungs: CTA B, no wheezing, rhonchi or rales Chest wall - mild echymosis noted over the sternum, ttp, no crepitus CV: RRR, no murmur, ext well perfused Abd: soft, notender, nondistended Back: mild ttp over the superior half of the T spine. No L spine ttp , no cva ttp Skin: no rashese, wnl Neuro: CN ii-xii grossly intact, no focal deficits EXT: no ttp, FROM without pain Psyche; normal affect,  calm and cooperative.   ED Course  Procedures (including critical care time) Labs Reviewed - No data to display Dg Chest 2  View  2020/02/812   *RADIOLOGY REPORT*  Clinical Data: Fall, hit chest.  Pain.  CHEST - 2 VIEW  Comparison: 10/02/2007  Findings: Mild cardiomegaly.  Bibasilar atelectasis.  No visible effusions.  No pneumothorax.  No visible rib fracture.  Moderate compression fracture in the lower thoracic spine, new since 2009.  IMPRESSION: Cardiomegaly, bibasilar atelectasis.  Moderate compression fracture in the lower thoracic spine, new since 2009.   Original Report Authenticated By: Charlett Nose, M.D.   Dg Thoracic Spine 2 View  2020/02/812   *RADIOLOGY REPORT*  Clinical Data: Fall.  Hit chest.  Pain.  THORACIC SPINE - 2 VIEW  Comparison: Chest x-ray 10/02/2007  Findings: Moderate compression fractures noted in the lower thoracic vertebral body, likely T11.  This is new since 2009. Slight compression through the superior endplate of L2.  No malalignment.  Degenerative changes throughout the thoracic spine.  Diffuse osteopenia.  IMPRESSION: Moderate compression fracture through T11.  Slight compression through the superior endplate of L2.   Original Report Authenticated By: Charlett Nose, M.D.   1. Contusion of chest wall, unspecified laterality, initial encounter   2. Fall, initial encounter   3. Back strain, initial encounter     MDM  See above  Brandt Loosen, MD 08/15/12 [phone]

## 2012-08-16 DIAGNOSIS — Z1331 Encounter for screening for depression: Secondary | ICD-10-CM | POA: Diagnosis not present

## 2012-08-16 DIAGNOSIS — S20219A Contusion of unspecified front wall of thorax, initial encounter: Secondary | ICD-10-CM | POA: Diagnosis not present

## 2012-08-20 DIAGNOSIS — M069 Rheumatoid arthritis, unspecified: Secondary | ICD-10-CM | POA: Diagnosis not present

## 2012-08-20 DIAGNOSIS — Z79899 Other long term (current) drug therapy: Secondary | ICD-10-CM | POA: Diagnosis not present

## 2012-09-16 DIAGNOSIS — E7800 Pure hypercholesterolemia, unspecified: Secondary | ICD-10-CM | POA: Diagnosis not present

## 2012-09-16 DIAGNOSIS — Z79899 Other long term (current) drug therapy: Secondary | ICD-10-CM | POA: Diagnosis not present

## 2012-09-18 DIAGNOSIS — H353290 Exudative age-related macular degeneration, unspecified eye, stage unspecified: Secondary | ICD-10-CM | POA: Diagnosis not present

## 2012-09-18 DIAGNOSIS — H35359 Cystoid macular degeneration, unspecified eye: Secondary | ICD-10-CM | POA: Diagnosis not present

## 2012-09-18 DIAGNOSIS — H35059 Retinal neovascularization, unspecified, unspecified eye: Secondary | ICD-10-CM | POA: Diagnosis not present

## 2012-09-20 DIAGNOSIS — D509 Iron deficiency anemia, unspecified: Secondary | ICD-10-CM | POA: Diagnosis not present

## 2012-09-20 DIAGNOSIS — F329 Major depressive disorder, single episode, unspecified: Secondary | ICD-10-CM | POA: Diagnosis not present

## 2012-09-20 DIAGNOSIS — I10 Essential (primary) hypertension: Secondary | ICD-10-CM | POA: Diagnosis not present

## 2012-09-20 DIAGNOSIS — E7800 Pure hypercholesterolemia, unspecified: Secondary | ICD-10-CM | POA: Diagnosis not present

## 2012-10-02 DIAGNOSIS — M069 Rheumatoid arthritis, unspecified: Secondary | ICD-10-CM | POA: Diagnosis not present

## 2012-10-02 DIAGNOSIS — Z79899 Other long term (current) drug therapy: Secondary | ICD-10-CM | POA: Diagnosis not present

## 2012-10-03 DIAGNOSIS — H5200 Hypermetropia, unspecified eye: Secondary | ICD-10-CM | POA: Diagnosis not present

## 2012-10-03 DIAGNOSIS — H4011X0 Primary open-angle glaucoma, stage unspecified: Secondary | ICD-10-CM | POA: Diagnosis not present

## 2012-10-03 DIAGNOSIS — H353290 Exudative age-related macular degeneration, unspecified eye, stage unspecified: Secondary | ICD-10-CM | POA: Diagnosis not present

## 2012-10-03 DIAGNOSIS — Z79899 Other long term (current) drug therapy: Secondary | ICD-10-CM | POA: Diagnosis not present

## 2012-10-15 DIAGNOSIS — M25549 Pain in joints of unspecified hand: Secondary | ICD-10-CM | POA: Diagnosis not present

## 2012-10-15 DIAGNOSIS — M069 Rheumatoid arthritis, unspecified: Secondary | ICD-10-CM | POA: Diagnosis not present

## 2012-10-15 DIAGNOSIS — Z79899 Other long term (current) drug therapy: Secondary | ICD-10-CM | POA: Diagnosis not present

## 2012-10-23 DIAGNOSIS — H35059 Retinal neovascularization, unspecified, unspecified eye: Secondary | ICD-10-CM | POA: Diagnosis not present

## 2012-10-23 DIAGNOSIS — H353290 Exudative age-related macular degeneration, unspecified eye, stage unspecified: Secondary | ICD-10-CM | POA: Diagnosis not present

## 2012-11-18 DIAGNOSIS — Z79899 Other long term (current) drug therapy: Secondary | ICD-10-CM | POA: Diagnosis not present

## 2012-11-27 DIAGNOSIS — H35059 Retinal neovascularization, unspecified, unspecified eye: Secondary | ICD-10-CM | POA: Diagnosis not present

## 2012-11-27 DIAGNOSIS — H353290 Exudative age-related macular degeneration, unspecified eye, stage unspecified: Secondary | ICD-10-CM | POA: Diagnosis not present

## 2012-12-03 DIAGNOSIS — Z79899 Other long term (current) drug therapy: Secondary | ICD-10-CM | POA: Diagnosis not present

## 2012-12-03 DIAGNOSIS — M2550 Pain in unspecified joint: Secondary | ICD-10-CM | POA: Diagnosis not present

## 2012-12-03 DIAGNOSIS — M069 Rheumatoid arthritis, unspecified: Secondary | ICD-10-CM | POA: Diagnosis not present

## 2012-12-03 DIAGNOSIS — R5381 Other malaise: Secondary | ICD-10-CM | POA: Diagnosis not present

## 2012-12-09 DIAGNOSIS — H40019 Open angle with borderline findings, low risk, unspecified eye: Secondary | ICD-10-CM | POA: Diagnosis not present

## 2012-12-09 DIAGNOSIS — Z23 Encounter for immunization: Secondary | ICD-10-CM | POA: Diagnosis not present

## 2013-01-01 DIAGNOSIS — H35059 Retinal neovascularization, unspecified, unspecified eye: Secondary | ICD-10-CM | POA: Diagnosis not present

## 2013-01-01 DIAGNOSIS — H353290 Exudative age-related macular degeneration, unspecified eye, stage unspecified: Secondary | ICD-10-CM | POA: Diagnosis not present

## 2013-01-22 ENCOUNTER — Other Ambulatory Visit: Payer: Self-pay | Admitting: Dermatology

## 2013-01-22 DIAGNOSIS — L570 Actinic keratosis: Secondary | ICD-10-CM | POA: Diagnosis not present

## 2013-01-22 DIAGNOSIS — C4441 Basal cell carcinoma of skin of scalp and neck: Secondary | ICD-10-CM | POA: Diagnosis not present

## 2013-01-22 DIAGNOSIS — L821 Other seborrheic keratosis: Secondary | ICD-10-CM | POA: Diagnosis not present

## 2013-02-03 DIAGNOSIS — Z79899 Other long term (current) drug therapy: Secondary | ICD-10-CM | POA: Diagnosis not present

## 2013-02-10 DIAGNOSIS — M069 Rheumatoid arthritis, unspecified: Secondary | ICD-10-CM | POA: Diagnosis not present

## 2013-02-10 DIAGNOSIS — H35059 Retinal neovascularization, unspecified, unspecified eye: Secondary | ICD-10-CM | POA: Diagnosis not present

## 2013-02-10 DIAGNOSIS — Z79899 Other long term (current) drug therapy: Secondary | ICD-10-CM | POA: Diagnosis not present

## 2013-02-10 DIAGNOSIS — H353290 Exudative age-related macular degeneration, unspecified eye, stage unspecified: Secondary | ICD-10-CM | POA: Diagnosis not present

## 2013-02-18 ENCOUNTER — Other Ambulatory Visit: Payer: Self-pay | Admitting: Dermatology

## 2013-02-18 DIAGNOSIS — C4441 Basal cell carcinoma of skin of scalp and neck: Secondary | ICD-10-CM | POA: Diagnosis not present

## 2013-03-06 DIAGNOSIS — E876 Hypokalemia: Secondary | ICD-10-CM | POA: Diagnosis not present

## 2013-03-24 DIAGNOSIS — H35059 Retinal neovascularization, unspecified, unspecified eye: Secondary | ICD-10-CM | POA: Diagnosis not present

## 2013-03-24 DIAGNOSIS — H353290 Exudative age-related macular degeneration, unspecified eye, stage unspecified: Secondary | ICD-10-CM | POA: Diagnosis not present

## 2013-03-25 DIAGNOSIS — Z79899 Other long term (current) drug therapy: Secondary | ICD-10-CM | POA: Diagnosis not present

## 2013-03-25 DIAGNOSIS — E7800 Pure hypercholesterolemia, unspecified: Secondary | ICD-10-CM | POA: Diagnosis not present

## 2013-03-25 DIAGNOSIS — M47817 Spondylosis without myelopathy or radiculopathy, lumbosacral region: Secondary | ICD-10-CM | POA: Diagnosis not present

## 2013-03-25 DIAGNOSIS — M5137 Other intervertebral disc degeneration, lumbosacral region: Secondary | ICD-10-CM | POA: Diagnosis not present

## 2013-03-28 DIAGNOSIS — Z1331 Encounter for screening for depression: Secondary | ICD-10-CM | POA: Diagnosis not present

## 2013-03-28 DIAGNOSIS — Z9181 History of falling: Secondary | ICD-10-CM | POA: Diagnosis not present

## 2013-03-28 DIAGNOSIS — I10 Essential (primary) hypertension: Secondary | ICD-10-CM | POA: Diagnosis not present

## 2013-03-28 DIAGNOSIS — F3289 Other specified depressive episodes: Secondary | ICD-10-CM | POA: Diagnosis not present

## 2013-03-28 DIAGNOSIS — F329 Major depressive disorder, single episode, unspecified: Secondary | ICD-10-CM | POA: Diagnosis not present

## 2013-03-28 DIAGNOSIS — D509 Iron deficiency anemia, unspecified: Secondary | ICD-10-CM | POA: Diagnosis not present

## 2013-03-28 DIAGNOSIS — E7800 Pure hypercholesterolemia, unspecified: Secondary | ICD-10-CM | POA: Diagnosis not present

## 2013-04-07 DIAGNOSIS — M069 Rheumatoid arthritis, unspecified: Secondary | ICD-10-CM | POA: Diagnosis not present

## 2013-04-07 DIAGNOSIS — M25549 Pain in joints of unspecified hand: Secondary | ICD-10-CM | POA: Diagnosis not present

## 2013-04-07 DIAGNOSIS — M810 Age-related osteoporosis without current pathological fracture: Secondary | ICD-10-CM | POA: Diagnosis not present

## 2013-04-07 DIAGNOSIS — Z79899 Other long term (current) drug therapy: Secondary | ICD-10-CM | POA: Diagnosis not present

## 2013-04-14 ENCOUNTER — Ambulatory Visit (INDEPENDENT_AMBULATORY_CARE_PROVIDER_SITE_OTHER): Payer: Medicare Other | Admitting: Diagnostic Neuroimaging

## 2013-04-14 ENCOUNTER — Encounter (INDEPENDENT_AMBULATORY_CARE_PROVIDER_SITE_OTHER): Payer: Self-pay

## 2013-04-14 ENCOUNTER — Encounter: Payer: Self-pay | Admitting: Diagnostic Neuroimaging

## 2013-04-14 VITALS — BP 152/78 | HR 71 | Temp 97.5°F | Ht 66.0 in | Wt 148.0 lb

## 2013-04-14 DIAGNOSIS — F0390 Unspecified dementia without behavioral disturbance: Secondary | ICD-10-CM

## 2013-04-14 DIAGNOSIS — F03A0 Unspecified dementia, mild, without behavioral disturbance, psychotic disturbance, mood disturbance, and anxiety: Secondary | ICD-10-CM

## 2013-04-14 MED ORDER — DONEPEZIL HCL 10 MG PO TABS: 10.0000 mg | ORAL_TABLET | Freq: Every day | ORAL | Status: DC

## 2013-04-14 NOTE — Progress Notes (Signed)
 [facility]  PATIENT: Brittany Solomon DOB: 05/30/1925  REFERRING CLINICIAN:  HISTORY FROM: patient, son and daughter-in-law REASON FOR VISIT: follow up   HISTORICAL  CHIEF COMPLAINT:  Chief Complaint  Patient presents with  . Follow-up    memory    HISTORY OF PRESENT ILLNESS:   UPDATE 04/14/13: Since last visit patient's memory has progressively worsened. Patient is no longer driving. Her son is not taking care of her bills. Son and daughter-in-law are helping her with her pills. Patient continues to live on her own. They family checks on her on a daily basis. She's becoming more withdrawn and feeling down. She complains of being stuck at home. Patient is now on Namenda XR 28 mg and tolerating it. She's also on donepezil 5mg at bedtime.  PRIOR HPI (04/29/12): 80-year-old right-handed female here for a evaluation of memory and balance difficulty. Has history of hypertension and rheumatoid arthritis.  Patient has noted some short-term memory difficulties. Patient's sister and son have also noticed. Symptoms have been going on for approximately one year. She's having difficulty with recent conversations, recent events and tasks. Patient still lives alone. She has someone who helps her with cleaning and yard work, due to limitations in her physical ability related to rheumatoid and osteoarthritis. Patient still drives (although not at night). She still cooks and pays bills. She is able to bathe and feed herself.  PCP has tried namenda and exelon patch, but patient couldn't tolerate.  Patient also having long-standing balance and gait difficulty, mainly related to arthritis and musculoskeletal issues. She last fell 3 years ago which he got tangled up with a dog. Patient has a cane at home but she does not use it.   REVIEW OF SYSTEMS: Full 14 system review of systems performed and notable only for dizziness agitation confusion depression nervous anxious joint pain back pain  walking difficulty light sensitivity loss of vision hearing loss.  ALLERGIES: No Known Allergies  HOME MEDICATIONS: Outpatient Prescriptions Prior to Visit  Medication Sig Dispense Refill  . aspirin EC 81 MG tablet Take 81 mg by mouth daily.      . esomeprazole (NEXIUM) 40 MG capsule Take 40 mg by mouth daily before breakfast.      . folic acid (FOLVITE) 1 MG tablet Take 1 mg by mouth daily.      . hydrochlorothiazide (HYDRODIURIL) 25 MG tablet Take 25 mg by mouth daily.      . Memantine HCl ER (NAMENDA XR) 28 MG CP24 Take 1 capsule by mouth daily.      . methotrexate (RHEUMATREX) 2.5 MG tablet Take 17.5 mg by mouth once a week. Caution:Chemotherapy. Protect from light.      . donepezil (ARICEPT) 5 MG tablet Take 5 mg by mouth at bedtime.      . ferrous sulfate 325 (65 FE) MG tablet Take 325 mg by mouth 2 (two) times daily.      . FLUoxetine (PROZAC) 20 MG capsule Take 20 mg by mouth daily.      . timolol (TIMOPTIC) 0.5 % ophthalmic solution Place 1 drop into both eyes at bedtime.       No facility-administered medications prior to visit.    PAST MEDICAL HISTORY: Past Medical History  Diagnosis Date  . Shingles   . Memory loss   . Osteoporosis   . Spinal stenosis   . Rheumatoid arthritis   . Diverticulitis   . Hypertension   . Hearing impairment   . Anemia   .   Hypercholesteremia   . Benign neoplasm of colon   . Depression     PAST SURGICAL HISTORY: Past Surgical History  Procedure Laterality Date  . Tonsillectomy and adenoidectomy    . Gallbladder surgery    . Vaginal hysterectomy    . Back surgery    . Ankle surgery      FAMILY HISTORY: Family History  Problem Relation Age of Onset  . Parkinsonism Mother   . Heart attack Father     SOCIAL HISTORY:  History   Social History  . Marital Status: Widowed    Spouse Name: N/A    Number of Children: 1  . Years of Education: Busn. [employer]   Occupational History  . Retired    Social History Main Topics  .  Smoking status: Never Smoker   . Smokeless tobacco: Never Used  . Alcohol Use: No  . Drug Use: No  . Sexual Activity: Not on file   Other Topics Concern  . Not on file   Social History Narrative   Patient lives at home alone.   Caffeine Use: none     PHYSICAL EXAM  Filed Vitals:   04/14/13 1102  BP: 152/78  Pulse: 71  Temp: 97.5 F (36.4 C)  TempSrc: Oral  Height: 5' 6" (1.676 m)  Weight: 148 lb (67.132 kg)    Not recorded    Body mass index is 23.9 kg/(m^2).  GENERAL EXAM: Patient is in no distress; well developed, nourished and groomed; neck is supple  CARDIOVASCULAR: Regular rate and rhythm, no murmurs, no carotid bruits  NEUROLOGIC: MENTAL STATUS: awake, alert, oriented to (2014, WINTER, FEB, Monday, [place], [place], [place]); SERIAL 7'S 1/5 ,RECALL 2/3. MMSE 21/30. Language fluent, comprehension intact, naming intact, fund of knowledge appropriate. POSITIVE SNOUT, MYERSON AND PALMOMENTAL. AFT 9.  CRANIAL NERVE: pupils equal and reactive to light, visual fields full to confrontation, extraocular muscles intact, no nystagmus, facial sensation and strength symmetric, hearing intact, palate elevates symmetrically, uvula midline, shoulder shrug symmetric, tongue midline. MOTOR: normal bulk and tone, full strength in the BUE, BLE SENSORY: normal and symmetric to light touch COORDINATION: finger-nose-finger, fine finger movements, heel-shin normal REFLEXES: deep tendon reflexes TRACE and symmetric GAIT/STATION: SLOW TO RISE, ANTALGIC GAIT. UNSTEADY.     DIAGNOSTIC DATA (LABS, IMAGING, TESTING) - I reviewed patient records, labs, notes, testing and imaging myself where available.  Lab Results  Component Value Date   WBC 7.0 07/31/2006   HGB 11.2* 07/31/2006   HCT 33.2* 07/31/2006   MCV 83.2 07/31/2006   PLT 331 07/31/2006      Component Value Date/Time   NA 134* 07/31/2006 1303   K 4.1 07/31/2006 1303   CL 98 07/31/2006 1303   CO2 27 07/31/2006 1303    GLUCOSE 97 07/31/2006 1303   BUN 17 07/31/2006 1303   CREATININE 0.58 07/31/2006 1303   CALCIUM 9.6 07/31/2006 1303   GFRNONAA >60 07/31/2006 1303   GFRAA  Value: >60        The eGFR has been calculated using the MDRD equation. This calculation has not been validated in all clinical 07/31/2006 1303   No results found for this basename: CHOL, HDL, LDLCALC, LDLDIRECT, TRIG, CHOLHDL   No results found for this basename: HGBA1C   No results found for this basename: VITAMINB12   No results found for this basename: TSH    04/29/12 Labs: TSH 1.34, B12 502  05/13/12 MRI brain (without):  1. Mild biparietal, moderate perisylivan, moderate corpus callosum and  severe mesial temporal atrophy.  2. Moderate periventricular and subcortical chronic small vessel ischemic disease.   ASSESSMENT AND PLAN  80 y.o. year old female here with mild dementia. Symptoms continue to be progressive. On namenda XR 57m + donepezil 552mdaily.   PLAN: - increase donepezil to 1068mightly - continue namenda XR 101m84mily - home health agency referral - extensive time spent discussing diagnosis, prognosis, safety issues and end of life planning   Orders Placed This Encounter  Procedures  . Home Health  . Face-to-face encounter (required for Medicare/Medicaid patients)   Return if symptoms worsen or fail to improve, for return to PCP.    VIKRPenni Bombard 2/237/[id number] in Neurology, Neurophysiology and Neuroimaging  [facility] [address] [phone]

## 2013-04-14 NOTE — Patient Instructions (Signed)
I will order home health referral.

## 2013-04-24 DIAGNOSIS — M069 Rheumatoid arthritis, unspecified: Secondary | ICD-10-CM | POA: Diagnosis not present

## 2013-04-24 DIAGNOSIS — Z79899 Other long term (current) drug therapy: Secondary | ICD-10-CM | POA: Diagnosis not present

## 2013-05-12 DIAGNOSIS — H35059 Retinal neovascularization, unspecified, unspecified eye: Secondary | ICD-10-CM | POA: Diagnosis not present

## 2013-05-12 DIAGNOSIS — H353290 Exudative age-related macular degeneration, unspecified eye, stage unspecified: Secondary | ICD-10-CM | POA: Diagnosis not present

## 2013-05-26 DIAGNOSIS — Z79899 Other long term (current) drug therapy: Secondary | ICD-10-CM | POA: Diagnosis not present

## 2013-06-30 DIAGNOSIS — Z6825 Body mass index (BMI) 25.0-25.9, adult: Secondary | ICD-10-CM | POA: Diagnosis not present

## 2013-06-30 DIAGNOSIS — H6120 Impacted cerumen, unspecified ear: Secondary | ICD-10-CM | POA: Diagnosis not present

## 2013-07-03 DIAGNOSIS — M069 Rheumatoid arthritis, unspecified: Secondary | ICD-10-CM | POA: Diagnosis not present

## 2013-07-03 DIAGNOSIS — Z79899 Other long term (current) drug therapy: Secondary | ICD-10-CM | POA: Diagnosis not present

## 2013-07-08 DIAGNOSIS — H353290 Exudative age-related macular degeneration, unspecified eye, stage unspecified: Secondary | ICD-10-CM | POA: Diagnosis not present

## 2013-07-08 DIAGNOSIS — H35059 Retinal neovascularization, unspecified, unspecified eye: Secondary | ICD-10-CM | POA: Diagnosis not present

## 2013-07-08 DIAGNOSIS — H35359 Cystoid macular degeneration, unspecified eye: Secondary | ICD-10-CM | POA: Diagnosis not present

## 2013-08-05 DIAGNOSIS — M545 Low back pain, unspecified: Secondary | ICD-10-CM | POA: Diagnosis not present

## 2013-08-05 DIAGNOSIS — Z09 Encounter for follow-up examination after completed treatment for conditions other than malignant neoplasm: Secondary | ICD-10-CM | POA: Diagnosis not present

## 2013-08-05 DIAGNOSIS — M810 Age-related osteoporosis without current pathological fracture: Secondary | ICD-10-CM | POA: Diagnosis not present

## 2013-08-05 DIAGNOSIS — M5137 Other intervertebral disc degeneration, lumbosacral region: Secondary | ICD-10-CM | POA: Diagnosis not present

## 2013-08-25 DIAGNOSIS — M47817 Spondylosis without myelopathy or radiculopathy, lumbosacral region: Secondary | ICD-10-CM | POA: Diagnosis not present

## 2013-09-02 DIAGNOSIS — Z79899 Other long term (current) drug therapy: Secondary | ICD-10-CM | POA: Diagnosis not present

## 2013-09-02 DIAGNOSIS — M069 Rheumatoid arthritis, unspecified: Secondary | ICD-10-CM | POA: Diagnosis not present

## 2013-09-09 DIAGNOSIS — H353290 Exudative age-related macular degeneration, unspecified eye, stage unspecified: Secondary | ICD-10-CM | POA: Diagnosis not present

## 2013-09-09 DIAGNOSIS — H35059 Retinal neovascularization, unspecified, unspecified eye: Secondary | ICD-10-CM | POA: Diagnosis not present

## 2013-09-16 DIAGNOSIS — M961 Postlaminectomy syndrome, not elsewhere classified: Secondary | ICD-10-CM | POA: Diagnosis not present

## 2013-09-16 DIAGNOSIS — M48061 Spinal stenosis, lumbar region without neurogenic claudication: Secondary | ICD-10-CM | POA: Diagnosis not present

## 2013-09-16 DIAGNOSIS — R209 Unspecified disturbances of skin sensation: Secondary | ICD-10-CM | POA: Diagnosis not present

## 2013-09-16 DIAGNOSIS — IMO0002 Reserved for concepts with insufficient information to code with codable children: Secondary | ICD-10-CM | POA: Diagnosis not present

## 2013-09-25 DIAGNOSIS — IMO0002 Reserved for concepts with insufficient information to code with codable children: Secondary | ICD-10-CM | POA: Diagnosis not present

## 2013-09-25 DIAGNOSIS — M961 Postlaminectomy syndrome, not elsewhere classified: Secondary | ICD-10-CM | POA: Diagnosis not present

## 2013-09-25 DIAGNOSIS — M48061 Spinal stenosis, lumbar region without neurogenic claudication: Secondary | ICD-10-CM | POA: Diagnosis not present

## 2013-09-29 DIAGNOSIS — Z79899 Other long term (current) drug therapy: Secondary | ICD-10-CM | POA: Diagnosis not present

## 2013-09-29 DIAGNOSIS — E7800 Pure hypercholesterolemia, unspecified: Secondary | ICD-10-CM | POA: Diagnosis not present

## 2013-10-01 DIAGNOSIS — I10 Essential (primary) hypertension: Secondary | ICD-10-CM | POA: Diagnosis not present

## 2013-10-01 DIAGNOSIS — N318 Other neuromuscular dysfunction of bladder: Secondary | ICD-10-CM | POA: Diagnosis not present

## 2013-10-01 DIAGNOSIS — F3289 Other specified depressive episodes: Secondary | ICD-10-CM | POA: Diagnosis not present

## 2013-10-01 DIAGNOSIS — F329 Major depressive disorder, single episode, unspecified: Secondary | ICD-10-CM | POA: Diagnosis not present

## 2013-10-01 DIAGNOSIS — R413 Other amnesia: Secondary | ICD-10-CM | POA: Diagnosis not present

## 2013-10-01 DIAGNOSIS — D509 Iron deficiency anemia, unspecified: Secondary | ICD-10-CM | POA: Diagnosis not present

## 2013-10-01 DIAGNOSIS — E7800 Pure hypercholesterolemia, unspecified: Secondary | ICD-10-CM | POA: Diagnosis not present

## 2013-10-14 DIAGNOSIS — IMO0002 Reserved for concepts with insufficient information to code with codable children: Secondary | ICD-10-CM | POA: Diagnosis not present

## 2013-10-14 DIAGNOSIS — M961 Postlaminectomy syndrome, not elsewhere classified: Secondary | ICD-10-CM | POA: Diagnosis not present

## 2013-10-14 DIAGNOSIS — M48061 Spinal stenosis, lumbar region without neurogenic claudication: Secondary | ICD-10-CM | POA: Diagnosis not present

## 2013-10-22 DIAGNOSIS — M48061 Spinal stenosis, lumbar region without neurogenic claudication: Secondary | ICD-10-CM | POA: Diagnosis not present

## 2013-10-22 DIAGNOSIS — M961 Postlaminectomy syndrome, not elsewhere classified: Secondary | ICD-10-CM | POA: Diagnosis not present

## 2013-10-22 DIAGNOSIS — IMO0002 Reserved for concepts with insufficient information to code with codable children: Secondary | ICD-10-CM | POA: Diagnosis not present

## 2013-10-28 DIAGNOSIS — M069 Rheumatoid arthritis, unspecified: Secondary | ICD-10-CM | POA: Diagnosis not present

## 2013-10-28 DIAGNOSIS — Z79899 Other long term (current) drug therapy: Secondary | ICD-10-CM | POA: Diagnosis not present

## 2013-12-01 DIAGNOSIS — Z79899 Other long term (current) drug therapy: Secondary | ICD-10-CM | POA: Diagnosis not present

## 2013-12-02 DIAGNOSIS — H35352 Cystoid macular degeneration, left eye: Secondary | ICD-10-CM | POA: Diagnosis not present

## 2013-12-02 DIAGNOSIS — H3532 Exudative age-related macular degeneration: Secondary | ICD-10-CM | POA: Diagnosis not present

## 2013-12-02 DIAGNOSIS — H35052 Retinal neovascularization, unspecified, left eye: Secondary | ICD-10-CM | POA: Diagnosis not present

## 2013-12-23 DIAGNOSIS — M25561 Pain in right knee: Secondary | ICD-10-CM | POA: Diagnosis not present

## 2013-12-23 DIAGNOSIS — M0589 Other rheumatoid arthritis with rheumatoid factor of multiple sites: Secondary | ICD-10-CM | POA: Diagnosis not present

## 2013-12-23 DIAGNOSIS — M79641 Pain in right hand: Secondary | ICD-10-CM | POA: Diagnosis not present

## 2013-12-23 DIAGNOSIS — M5137 Other intervertebral disc degeneration, lumbosacral region: Secondary | ICD-10-CM | POA: Diagnosis not present

## 2013-12-24 DIAGNOSIS — Z23 Encounter for immunization: Secondary | ICD-10-CM | POA: Diagnosis not present

## 2013-12-30 DIAGNOSIS — M0589 Other rheumatoid arthritis with rheumatoid factor of multiple sites: Secondary | ICD-10-CM | POA: Diagnosis not present

## 2013-12-30 DIAGNOSIS — Z79899 Other long term (current) drug therapy: Secondary | ICD-10-CM | POA: Diagnosis not present

## 2014-01-02 DIAGNOSIS — Z79899 Other long term (current) drug therapy: Secondary | ICD-10-CM | POA: Diagnosis not present

## 2014-02-24 DIAGNOSIS — M069 Rheumatoid arthritis, unspecified: Secondary | ICD-10-CM | POA: Diagnosis not present

## 2014-02-24 DIAGNOSIS — M545 Low back pain: Secondary | ICD-10-CM | POA: Diagnosis not present

## 2014-02-24 DIAGNOSIS — M19041 Primary osteoarthritis, right hand: Secondary | ICD-10-CM | POA: Diagnosis not present

## 2014-02-27 ENCOUNTER — Other Ambulatory Visit (HOSPITAL_COMMUNITY): Payer: Self-pay | Admitting: *Deleted

## 2014-02-27 DIAGNOSIS — Z111 Encounter for screening for respiratory tuberculosis: Secondary | ICD-10-CM | POA: Diagnosis not present

## 2014-03-02 ENCOUNTER — Encounter (HOSPITAL_COMMUNITY)
Admission: RE | Admit: 2014-03-02 | Discharge: 2014-03-02 | Disposition: A | Discharge status: Home / Self Care | Payer: Medicare Other | Source: Ambulatory Visit | Attending: Rheumatology | Admitting: Rheumatology

## 2014-03-02 DIAGNOSIS — M069 Rheumatoid arthritis, unspecified: Secondary | ICD-10-CM | POA: Insufficient documentation

## 2014-03-02 DIAGNOSIS — Z5181 Encounter for therapeutic drug level monitoring: Secondary | ICD-10-CM | POA: Diagnosis not present

## 2014-03-02 MED ORDER — SODIUM CHLORIDE 0.9 % IV SOLN
142.7200 mg | INTRAVENOUS | Status: DC
  Administered 2014-03-02: 142.72 mg via INTRAVENOUS
  Filled 2014-03-02: qty 11.4

## 2014-03-02 MED ORDER — SODIUM CHLORIDE 0.9 % IV SOLN
INTRAVENOUS | Status: DC
  Administered 2014-03-02: 09:00:00 via INTRAVENOUS

## 2014-03-03 DIAGNOSIS — Z79899 Other long term (current) drug therapy: Secondary | ICD-10-CM | POA: Diagnosis not present

## 2014-03-10 DIAGNOSIS — H3531 Nonexudative age-related macular degeneration: Secondary | ICD-10-CM | POA: Diagnosis not present

## 2014-03-10 DIAGNOSIS — H35363 Drusen (degenerative) of macula, bilateral: Secondary | ICD-10-CM | POA: Diagnosis not present

## 2014-03-31 DIAGNOSIS — M961 Postlaminectomy syndrome, not elsewhere classified: Secondary | ICD-10-CM | POA: Diagnosis not present

## 2014-03-31 DIAGNOSIS — M5416 Radiculopathy, lumbar region: Secondary | ICD-10-CM | POA: Diagnosis not present

## 2014-04-06 DIAGNOSIS — Z79899 Other long term (current) drug therapy: Secondary | ICD-10-CM | POA: Diagnosis not present

## 2014-04-06 DIAGNOSIS — E780 Pure hypercholesterolemia: Secondary | ICD-10-CM | POA: Diagnosis not present

## 2014-04-08 DIAGNOSIS — E780 Pure hypercholesterolemia: Secondary | ICD-10-CM | POA: Diagnosis not present

## 2014-04-08 DIAGNOSIS — Z23 Encounter for immunization: Secondary | ICD-10-CM | POA: Diagnosis not present

## 2014-04-08 DIAGNOSIS — D509 Iron deficiency anemia, unspecified: Secondary | ICD-10-CM | POA: Diagnosis not present

## 2014-04-08 DIAGNOSIS — F329 Major depressive disorder, single episode, unspecified: Secondary | ICD-10-CM | POA: Diagnosis not present

## 2014-04-08 DIAGNOSIS — Z1389 Encounter for screening for other disorder: Secondary | ICD-10-CM | POA: Diagnosis not present

## 2014-04-08 DIAGNOSIS — I10 Essential (primary) hypertension: Secondary | ICD-10-CM | POA: Diagnosis not present

## 2014-04-17 ENCOUNTER — Other Ambulatory Visit (HOSPITAL_COMMUNITY): Payer: Self-pay | Admitting: *Deleted

## 2014-04-20 ENCOUNTER — Encounter (HOSPITAL_COMMUNITY)
Admission: RE | Admit: 2014-04-20 | Discharge: 2014-04-20 | Disposition: A | Discharge status: Home / Self Care | Payer: Medicare Other | Source: Ambulatory Visit | Attending: Rheumatology | Admitting: Rheumatology

## 2014-04-20 DIAGNOSIS — Z5181 Encounter for therapeutic drug level monitoring: Secondary | ICD-10-CM | POA: Diagnosis not present

## 2014-04-20 DIAGNOSIS — M069 Rheumatoid arthritis, unspecified: Secondary | ICD-10-CM | POA: Diagnosis not present

## 2014-04-20 MED ORDER — SODIUM CHLORIDE 0.9 % IV SOLN
142.7200 mg | INTRAVENOUS | Status: AC
  Administered 2014-04-20: 142.72 mg via INTRAVENOUS
  Filled 2014-04-20: qty 11.4

## 2014-04-20 MED ORDER — SODIUM CHLORIDE 0.9 % IV SOLN
142.7200 mg | INTRAVENOUS | Status: DC
  Filled 2014-04-20: qty 11.4

## 2014-04-20 MED ORDER — SODIUM CHLORIDE 0.9 % IV SOLN
INTRAVENOUS | Status: AC
  Administered 2014-04-20: 09:00:00 via INTRAVENOUS

## 2014-05-20 ENCOUNTER — Ambulatory Visit (INDEPENDENT_AMBULATORY_CARE_PROVIDER_SITE_OTHER): Payer: Medicare Other | Admitting: Diagnostic Neuroimaging

## 2014-05-20 ENCOUNTER — Encounter: Payer: Self-pay | Admitting: Diagnostic Neuroimaging

## 2014-05-20 VITALS — BP 147/77 | HR 72 | Temp 97.5°F | Resp 16 | Ht 66.0 in | Wt 152.0 lb

## 2014-05-20 DIAGNOSIS — F0390 Unspecified dementia without behavioral disturbance: Secondary | ICD-10-CM

## 2014-05-20 DIAGNOSIS — F03A0 Unspecified dementia, mild, without behavioral disturbance, psychotic disturbance, mood disturbance, and anxiety: Secondary | ICD-10-CM

## 2014-05-20 MED ORDER — MEMANTINE HCL 10 MG PO TABS: 10.0000 mg | ORAL_TABLET | Freq: Two times a day (BID) | ORAL | Status: DC

## 2014-05-20 MED ORDER — DONEPEZIL HCL 10 MG PO TABS: 10.0000 mg | ORAL_TABLET | Freq: Every day | ORAL | Status: DC

## 2014-05-20 NOTE — Patient Instructions (Signed)
Continue current medications.   Caution with living alone. Increase supervision and time with family / friends.

## 2014-05-20 NOTE — Progress Notes (Signed)
[facility]  PATIENT: Brittany Solomon DOB: 79-03-1925  REFERRING CLINICIAN:  HISTORY FROM: patient, son and daughter-in-law REASON FOR VISIT: follow up   HISTORICAL  CHIEF COMPLAINT:  Chief Complaint  Patient presents with  . Mild Dementia    Rm 7     HISTORY OF PRESENT ILLNESS:   UPDATE 05/20/14: Since last visit, memory loss has continued. Her vision is deteriorating as well (macular degeneration). Still living alone. Has a cleaning lady to help at home. Balance worsening.  UPDATE 04/14/13: Since last visit patient's memory has progressively worsened. Patient is no longer driving. Her son is taking care of her bills. Son and daughter-in-law are helping her with her pills. Patient continues to live on her own. They family checks on her on a daily basis. She's becoming more withdrawn and feeling down. She complains of being stuck at home. Patient is now on Namenda XR 28 mg and tolerating it. She's also on donepezil 54m at bedtime.  PRIOR HPI (04/29/12): 81year old right-handed female here for a evaluation of memory and balance difficulty. Has history of hypertension and rheumatoid arthritis. Patient has noted some short-term memory difficulties. Patient's sister and son have also noticed. Symptoms have been going on for approximately one year. She's having difficulty with recent conversations, recent events and tasks. Patient still lives alone. She has someone who helps her with cleaning and yard work, due to limitations in her physical ability related to rheumatoid and osteoarthritis. Patient still drives (although not at night). She still cooks and pays bills. She is able to bathe and feed herself. PCP has tried namenda and exelon patch, but patient couldn't tolerate. Patient also having long-standing balance and gait difficulty, mainly related to arthritis and musculoskeletal issues. She last fell 3 years ago when she got tangled up with a dog. Patient has a cane at home  but she does not use it.   REVIEW OF SYSTEMS: Full 14 system review of systems performed and notable only for agiation confusion back pain decr activity.    ALLERGIES: Allergies  Allergen Reactions  . Amoxicillin Rash    HOME MEDICATIONS: Outpatient Prescriptions Prior to Visit  Medication Sig Dispense Refill  . aspirin EC 81 MG tablet Take 81 mg by mouth daily.    . calcium carbonate (OS-CAL) 600 MG TABS tablet Take 600 mg by mouth 2 (two) times daily with a meal.    . celecoxib (CELEBREX) 100 MG capsule Take 1 capsule by mouth daily.    . Cholecalciferol (VITAMIN D3) 400 UNITS CAPS Take 1 capsule by mouth daily.    .Marland Kitchendonepezil (ARICEPT) 10 MG tablet Take 1 tablet (10 mg total) by mouth at bedtime. 90 tablet 4  . ferrous sulfate 325 (65 FE) MG tablet Take 325 mg by mouth daily with breakfast.    . folic acid (FOLVITE) 1 MG tablet Take 1 mg by mouth daily.    . hydrochlorothiazide (HYDRODIURIL) 25 MG tablet Take 25 mg by mouth daily.    . Memantine HCl ER (NAMENDA XR) 28 MG CP24 Take 1 capsule by mouth daily.    . Omega-3 Fatty Acids (FISH OIL) 1200 MG CAPS Take 1 capsule by mouth daily.    . Omeprazole Magnesium 20.6 (20 BASE) MG CPDR Take 1 capsule by mouth daily.    .Marland KitchenPARoxetine (PAXIL) 20 MG tablet Take 1 tablet by mouth daily.    . potassium chloride SA (K-DUR,KLOR-CON) 20 MEQ tablet Take 1 tablet by mouth daily.    .Marland Kitchen  pravastatin (PRAVACHOL) 40 MG tablet Take 1 tablet by mouth daily.    . vitamin E (VITAMIN E) 400 UNIT capsule Take 400 Units by mouth daily.    Marland Kitchen esomeprazole (NEXIUM) 40 MG capsule Take 40 mg by mouth daily before breakfast.    . methotrexate (RHEUMATREX) 2.5 MG tablet Take 17.5 mg by mouth once a week. Caution:Chemotherapy. Protect from light.    . Multiple Vitamins-Minerals (PRESERVISION AREDS 2 PO) Take 2 tablets by mouth daily.     No facility-administered medications prior to visit.    PAST MEDICAL HISTORY: Past Medical History  Diagnosis Date  .  Shingles   . Memory loss   . Osteoporosis   . Spinal stenosis   . Rheumatoid arthritis   . Diverticulitis   . Hypertension   . Hearing impairment   . Anemia   . Hypercholesteremia   . Benign neoplasm of colon   . Depression     PAST SURGICAL HISTORY: Past Surgical History  Procedure Laterality Date  . Tonsillectomy and adenoidectomy    . Gallbladder surgery    . Vaginal hysterectomy    . Back surgery    . Ankle surgery      FAMILY HISTORY: Family History  Problem Relation Age of Onset  . Parkinsonism Mother   . Heart attack Father     SOCIAL HISTORY:  History   Social History  . Marital Status: Widowed    Spouse Name: N/A  . Number of Children: 1  . Years of Education: Busn. Sch   Occupational History  . Retired    Social History Main Topics  . Smoking status: Never Smoker   . Smokeless tobacco: Never Used  . Alcohol Use: No  . Drug Use: No  . Sexual Activity: Not on file   Other Topics Concern  . Not on file   Social History Narrative   Patient lives at home alone.   Caffeine Use: none     PHYSICAL EXAM  Filed Vitals:   05/20/14 0953  BP: 147/77  Pulse: 72  Temp: 97.5 F (36.4 C)  TempSrc: Oral  Resp: 16  Height: _0  (1.676 m)  Weight: 152 lb (68.947 kg)    Not recorded     Body mass index is 24.55 kg/(m^2).  MMSE - Mini Mental State Exam 05/20/2014  Orientation to time 2  Orientation to Place 3  Registration 2  Attention/ Calculation 5  Recall 0  Language- name 2 objects 2  Language- repeat 1  Language- follow 3 step command 3  Language- read & follow direction 1  Write a sentence 1  Copy design 0  Total score 20    GENERAL EXAM: Patient is in no distress; well developed, nourished and groomed; neck is supple  CARDIOVASCULAR: Regular rate and rhythm, no murmurs, no carotid bruits  NEUROLOGIC: MENTAL STATUS: awake, alert, DECR FLUENCY, comprehension intact, naming intact, fund of knowledge appropriate. POSITIVE  SNOUT, MYERSON AND PALMOMENTAL. AFT 11.  CRANIAL NERVE: pupils equal and reactive to light, visual fields full to confrontation, extraocular muscles intact, no nystagmus, facial sensation and strength symmetric, hearing intact, palate elevates symmetrically, uvula midline, shoulder shrug symmetric, tongue midline. MOTOR: normal bulk and tone, full strength in the BUE, BLE SENSORY: normal and symmetric to light touch COORDINATION: finger-nose-finger, fine finger movements, heel-shin normal REFLEXES: deep tendon reflexes TRACE and symmetric GAIT/STATION: SLOW TO RISE, ANTALGIC GAIT. UNSTEADY.     DIAGNOSTIC DATA (LABS, IMAGING, TESTING) - I reviewed patient records, labs,  notes, testing and imaging myself where available.  Lab Results  Component Value Date   WBC 7.0 07/31/2006   HGB 11.2* 07/31/2006   HCT 33.2* 07/31/2006   MCV 83.2 07/31/2006   PLT 331 07/31/2006      Component Value Date/Time   NA 134* 07/31/2006 1303   K 4.1 07/31/2006 1303   CL 98 07/31/2006 1303   CO2 27 07/31/2006 1303   GLUCOSE 97 07/31/2006 1303   BUN 17 07/31/2006 1303   CREATININE 0.58 07/31/2006 1303   CALCIUM 9.6 07/31/2006 1303   GFRNONAA >60 07/31/2006 1303   GFRAA  07/31/2006 1303    >60        The eGFR has been calculated using the MDRD equation. This calculation has not been validated in all clinical   No results found for: CHOL No results found for: HGBA1C No results found for: VITAMINB12 No results found for: TSH  04/29/12 Labs: TSH 1.34, B12 502  05/13/12 MRI brain (without):  1. Mild biparietal, moderate perisylivan, moderate corpus callosum and severe mesial temporal atrophy.  2. Moderate periventricular and subcortical chronic small vessel ischemic disease.   ASSESSMENT AND PLAN  81 y.o. year old female here with mild dementia. Symptoms continue to be progressive. On namenda XR 53m + donepezil 577mdaily.   PLAN: - CONTINUE donepezil 1057mhs + memantine 80m37mD - extensive  time spent discussing diagnosis, prognosis, safety issues and end of life planning; advised caution with living alone and recommended increased supervision and assistance  Return if symptoms worsen or fail to improve, for return to PCP.    VIKRPenni Bombard 3/308/18/[id number] in Neurology, Neurophysiology and Neuroimaging  [facility] [address] [phone]

## 2014-06-08 DIAGNOSIS — Z79899 Other long term (current) drug therapy: Secondary | ICD-10-CM | POA: Diagnosis not present

## 2014-06-12 ENCOUNTER — Other Ambulatory Visit (HOSPITAL_COMMUNITY): Payer: Self-pay | Admitting: *Deleted

## 2014-06-15 ENCOUNTER — Ambulatory Visit (HOSPITAL_COMMUNITY)
Admission: RE | Admit: 2014-06-15 | Discharge: 2014-06-15 | Disposition: A | Discharge status: Home / Self Care | Payer: Medicare Other | Source: Ambulatory Visit | Attending: Rheumatology | Admitting: Rheumatology

## 2014-06-15 ENCOUNTER — Encounter (HOSPITAL_COMMUNITY): Payer: Medicare Other

## 2014-06-15 DIAGNOSIS — M069 Rheumatoid arthritis, unspecified: Secondary | ICD-10-CM | POA: Insufficient documentation

## 2014-06-15 LAB — CBC WITH DIFFERENTIAL/PLATELET
BASOS ABS: 0.1 10*3/uL (ref 0.0–0.1)
Basophils Relative: 1 % (ref 0–1)
EOS ABS: 0.2 10*3/uL (ref 0.0–0.7)
EOS PCT: 4 % (ref 0–5)
HEMATOCRIT: 41.5 % (ref 36.0–46.0)
HEMOGLOBIN: 14 g/dL (ref 12.0–15.0)
Lymphocytes Relative: 29 % (ref 12–46)
Lymphs Abs: 1.5 10*3/uL (ref 0.7–4.0)
MCH: 32.6 pg (ref 26.0–34.0)
MCHC: 33.7 g/dL (ref 30.0–36.0)
MCV: 96.7 fL (ref 78.0–100.0)
Monocytes Absolute: 0.7 10*3/uL (ref 0.1–1.0)
Monocytes Relative: 13 % — ABNORMAL HIGH (ref 3–12)
NEUTROS ABS: 2.7 10*3/uL (ref 1.7–7.7)
NEUTROS PCT: 53 % (ref 43–77)
Platelets: 158 10*3/uL (ref 150–400)
RBC: 4.29 MIL/uL (ref 3.87–5.11)
RDW: 14.4 % (ref 11.5–15.5)
WBC: 5.1 10*3/uL (ref 4.0–10.5)

## 2014-06-15 LAB — COMPREHENSIVE METABOLIC PANEL
ALK PHOS: 66 U/L (ref 39–117)
ALT: 23 U/L (ref 0–35)
AST: 40 U/L — AB (ref 0–37)
Albumin: 4 g/dL (ref 3.5–5.2)
Anion gap: 11 (ref 5–15)
BILIRUBIN TOTAL: 0.7 mg/dL (ref 0.3–1.2)
BUN: 19 mg/dL (ref 6–23)
CO2: 28 mmol/L (ref 19–32)
Calcium: 9.4 mg/dL (ref 8.4–10.5)
Chloride: 102 mmol/L (ref 96–112)
Creatinine, Ser: 0.72 mg/dL (ref 0.50–1.10)
GFR calc Af Amer: 88 mL/min — ABNORMAL LOW (ref >90)
GFR, EST NON AFRICAN AMERICAN: 76 mL/min — AB (ref >90)
GLUCOSE: 89 mg/dL (ref 70–99)
Potassium: 3.6 mmol/L (ref 3.5–5.1)
Sodium: 141 mmol/L (ref 135–145)
TOTAL PROTEIN: 6.7 g/dL (ref 6.0–8.3)

## 2014-06-15 MED ORDER — DIPHENHYDRAMINE HCL 25 MG PO CAPS: 50.0000 mg | ORAL_CAPSULE | ORAL | Status: DC

## 2014-06-15 MED ORDER — ACETAMINOPHEN 325 MG PO TABS: 650.0000 mg | ORAL_TABLET | ORAL | Status: DC

## 2014-06-15 MED ORDER — SODIUM CHLORIDE 0.9 % IV SOLN
140.0000 mg | INTRAVENOUS | Status: DC
  Administered 2014-06-15: 140 mg via INTRAVENOUS
  Filled 2014-06-15: qty 11.2

## 2014-06-15 MED ORDER — SODIUM CHLORIDE 0.9 % IV SOLN
INTRAVENOUS | Status: DC
  Administered 2014-06-15: 10:00:00 via INTRAVENOUS

## 2014-06-22 DIAGNOSIS — M0579 Rheumatoid arthritis with rheumatoid factor of multiple sites without organ or systems involvement: Secondary | ICD-10-CM | POA: Diagnosis not present

## 2014-06-22 DIAGNOSIS — Z79899 Other long term (current) drug therapy: Secondary | ICD-10-CM | POA: Diagnosis not present

## 2014-06-22 DIAGNOSIS — M25571 Pain in right ankle and joints of right foot: Secondary | ICD-10-CM | POA: Diagnosis not present

## 2014-06-22 DIAGNOSIS — M79641 Pain in right hand: Secondary | ICD-10-CM | POA: Diagnosis not present

## 2014-07-22 DIAGNOSIS — M5416 Radiculopathy, lumbar region: Secondary | ICD-10-CM | POA: Diagnosis not present

## 2014-07-22 DIAGNOSIS — M961 Postlaminectomy syndrome, not elsewhere classified: Secondary | ICD-10-CM | POA: Diagnosis not present

## 2014-08-07 ENCOUNTER — Other Ambulatory Visit (HOSPITAL_COMMUNITY): Payer: Self-pay | Admitting: *Deleted

## 2014-08-10 ENCOUNTER — Encounter (HOSPITAL_COMMUNITY)
Admission: RE | Admit: 2014-08-10 | Discharge: 2014-08-10 | Disposition: A | Discharge status: Home / Self Care | Payer: Medicare Other | Source: Ambulatory Visit | Attending: Rheumatology | Admitting: Rheumatology

## 2014-08-10 DIAGNOSIS — M069 Rheumatoid arthritis, unspecified: Secondary | ICD-10-CM | POA: Insufficient documentation

## 2014-08-10 LAB — COMPREHENSIVE METABOLIC PANEL
ALBUMIN: 3.4 g/dL — AB (ref 3.5–5.0)
ALT: 16 U/L (ref 14–54)
ANION GAP: 7 (ref 5–15)
AST: 30 U/L (ref 15–41)
Alkaline Phosphatase: 73 U/L (ref 38–126)
BILIRUBIN TOTAL: 0.9 mg/dL (ref 0.3–1.2)
BUN: 13 mg/dL (ref 6–20)
CALCIUM: 9.1 mg/dL (ref 8.9–10.3)
CHLORIDE: 103 mmol/L (ref 101–111)
CO2: 30 mmol/L (ref 22–32)
CREATININE: 0.66 mg/dL (ref 0.44–1.00)
GFR calc Af Amer: >60 mL/min (ref >60)
Glucose, Bld: 91 mg/dL (ref 65–99)
Potassium: 3.4 mmol/L — ABNORMAL LOW (ref 3.5–5.1)
SODIUM: 140 mmol/L (ref 135–145)
Total Protein: 6.1 g/dL — ABNORMAL LOW (ref 6.5–8.1)

## 2014-08-10 LAB — DIFFERENTIAL
Basophils Absolute: 0.1 10*3/uL (ref 0.0–0.1)
Basophils Relative: 1 % (ref 0–1)
Eosinophils Absolute: 0.2 10*3/uL (ref 0.0–0.7)
Eosinophils Relative: 4 % (ref 0–5)
LYMPHS PCT: 32 % (ref 12–46)
Lymphs Abs: 1.5 10*3/uL (ref 0.7–4.0)
MONOS PCT: 9 % (ref 3–12)
Monocytes Absolute: 0.4 10*3/uL (ref 0.1–1.0)
NEUTROS ABS: 2.5 10*3/uL (ref 1.7–7.7)
NEUTROS PCT: 54 % (ref 43–77)

## 2014-08-10 LAB — CBC
HCT: 38 % (ref 36.0–46.0)
HEMOGLOBIN: 12.9 g/dL (ref 12.0–15.0)
MCH: 32.3 pg (ref 26.0–34.0)
MCHC: 33.9 g/dL (ref 30.0–36.0)
MCV: 95.2 fL (ref 78.0–100.0)
Platelets: 148 10*3/uL — ABNORMAL LOW (ref 150–400)
RBC: 3.99 MIL/uL (ref 3.87–5.11)
RDW: 13.2 % (ref 11.5–15.5)
WBC: 4.7 10*3/uL (ref 4.0–10.5)

## 2014-08-10 MED ORDER — ACETAMINOPHEN 325 MG PO TABS
650.0000 mg | ORAL_TABLET | ORAL | Status: AC
  Administered 2014-08-10: 650 mg via ORAL

## 2014-08-10 MED ORDER — SODIUM CHLORIDE 0.9 % IV SOLN
INTRAVENOUS | Status: AC
  Administered 2014-08-10: 250 mL via INTRAVENOUS

## 2014-08-10 MED ORDER — SODIUM CHLORIDE 0.9 % IV SOLN
140.0000 mg | INTRAVENOUS | Status: AC
  Administered 2014-08-10: 140 mg via INTRAVENOUS
  Filled 2014-08-10: qty 11.2

## 2014-08-10 MED ORDER — ACETAMINOPHEN 325 MG PO TABS
ORAL_TABLET | ORAL | Status: AC
  Administered 2014-08-10: 650 mg via ORAL
  Filled 2014-08-10: qty 2

## 2014-08-10 MED ORDER — DIPHENHYDRAMINE HCL 25 MG PO CAPS
50.0000 mg | ORAL_CAPSULE | ORAL | Status: AC
  Administered 2014-08-10: 50 mg via ORAL

## 2014-08-10 MED ORDER — DIPHENHYDRAMINE HCL 25 MG PO CAPS
ORAL_CAPSULE | ORAL | Status: AC
  Administered 2014-08-10: 50 mg via ORAL
  Filled 2014-08-10: qty 2

## 2014-09-08 DIAGNOSIS — H35363 Drusen (degenerative) of macula, bilateral: Secondary | ICD-10-CM | POA: Diagnosis not present

## 2014-09-08 DIAGNOSIS — H35361 Drusen (degenerative) of macula, right eye: Secondary | ICD-10-CM | POA: Diagnosis not present

## 2014-09-08 DIAGNOSIS — H3531 Nonexudative age-related macular degeneration: Secondary | ICD-10-CM | POA: Diagnosis not present

## 2014-10-02 ENCOUNTER — Other Ambulatory Visit (HOSPITAL_COMMUNITY): Payer: Self-pay | Admitting: *Deleted

## 2014-10-05 ENCOUNTER — Encounter (HOSPITAL_COMMUNITY)
Admission: RE | Admit: 2014-10-05 | Discharge: 2014-10-05 | Disposition: A | Discharge status: Home / Self Care | Payer: Medicare Other | Source: Ambulatory Visit | Attending: Rheumatology | Admitting: Rheumatology

## 2014-10-05 DIAGNOSIS — M069 Rheumatoid arthritis, unspecified: Secondary | ICD-10-CM | POA: Insufficient documentation

## 2014-10-05 LAB — CBC WITH DIFFERENTIAL/PLATELET
Basophils Absolute: 0 10*3/uL (ref 0.0–0.1)
Basophils Relative: 1 % (ref 0–1)
Eosinophils Absolute: 0.2 10*3/uL (ref 0.0–0.7)
Eosinophils Relative: 4 % (ref 0–5)
HCT: 39.3 % (ref 36.0–46.0)
HEMOGLOBIN: 13.5 g/dL (ref 12.0–15.0)
Lymphocytes Relative: 27 % (ref 12–46)
Lymphs Abs: 1.5 10*3/uL (ref 0.7–4.0)
MCH: 33.1 pg (ref 26.0–34.0)
MCHC: 34.4 g/dL (ref 30.0–36.0)
MCV: 96.3 fL (ref 78.0–100.0)
MONOS PCT: 11 % (ref 3–12)
Monocytes Absolute: 0.6 10*3/uL (ref 0.1–1.0)
NEUTROS PCT: 57 % (ref 43–77)
Neutro Abs: 3 10*3/uL (ref 1.7–7.7)
Platelets: 156 10*3/uL (ref 150–400)
RBC: 4.08 MIL/uL (ref 3.87–5.11)
RDW: 13.8 % (ref 11.5–15.5)
WBC: 5.3 10*3/uL (ref 4.0–10.5)

## 2014-10-05 LAB — COMPREHENSIVE METABOLIC PANEL
ALK PHOS: 68 U/L (ref 38–126)
ALT: 15 U/L (ref 14–54)
ANION GAP: 7 (ref 5–15)
AST: 30 U/L (ref 15–41)
Albumin: 3.7 g/dL (ref 3.5–5.0)
BUN: 17 mg/dL (ref 6–20)
CHLORIDE: 106 mmol/L (ref 101–111)
CO2: 29 mmol/L (ref 22–32)
Calcium: 9.1 mg/dL (ref 8.9–10.3)
Creatinine, Ser: 0.68 mg/dL (ref 0.44–1.00)
GFR calc Af Amer: >60 mL/min (ref >60)
GFR calc non Af Amer: >60 mL/min (ref >60)
GLUCOSE: 89 mg/dL (ref 65–99)
Potassium: 3.6 mmol/L (ref 3.5–5.1)
SODIUM: 142 mmol/L (ref 135–145)
Total Bilirubin: 0.7 mg/dL (ref 0.3–1.2)
Total Protein: 5.9 g/dL — ABNORMAL LOW (ref 6.5–8.1)

## 2014-10-05 MED ORDER — DIPHENHYDRAMINE HCL 25 MG PO CAPS
25.0000 mg | ORAL_CAPSULE | ORAL | Status: DC
  Administered 2014-10-05: 25 mg via ORAL

## 2014-10-05 MED ORDER — ACETAMINOPHEN 325 MG PO TABS
650.0000 mg | ORAL_TABLET | ORAL | Status: DC
  Administered 2014-10-05: 650 mg via ORAL

## 2014-10-05 MED ORDER — GOLIMUMAB 50 MG/4ML IV SOLN
140.0000 mg | INTRAVENOUS | Status: DC
  Administered 2014-10-05: 140 mg via INTRAVENOUS
  Filled 2014-10-05: qty 11.2

## 2014-10-05 MED ORDER — SODIUM CHLORIDE 0.9 % IV SOLN
INTRAVENOUS | Status: DC
  Administered 2014-10-05: 250 mL via INTRAVENOUS

## 2014-10-05 MED ORDER — ACETAMINOPHEN 325 MG PO TABS
ORAL_TABLET | ORAL | Status: AC
  Filled 2014-10-05: qty 2

## 2014-10-05 MED ORDER — DIPHENHYDRAMINE HCL 25 MG PO CAPS
ORAL_CAPSULE | ORAL | Status: AC
  Filled 2014-10-05: qty 1

## 2014-10-12 DIAGNOSIS — E780 Pure hypercholesterolemia: Secondary | ICD-10-CM | POA: Diagnosis not present

## 2014-10-12 DIAGNOSIS — Z79899 Other long term (current) drug therapy: Secondary | ICD-10-CM | POA: Diagnosis not present

## 2014-10-14 DIAGNOSIS — E780 Pure hypercholesterolemia: Secondary | ICD-10-CM | POA: Diagnosis not present

## 2014-10-14 DIAGNOSIS — F329 Major depressive disorder, single episode, unspecified: Secondary | ICD-10-CM | POA: Diagnosis not present

## 2014-10-14 DIAGNOSIS — Z9181 History of falling: Secondary | ICD-10-CM | POA: Diagnosis not present

## 2014-10-14 DIAGNOSIS — R413 Other amnesia: Secondary | ICD-10-CM | POA: Diagnosis not present

## 2014-10-14 DIAGNOSIS — D509 Iron deficiency anemia, unspecified: Secondary | ICD-10-CM | POA: Diagnosis not present

## 2014-10-14 DIAGNOSIS — Z6826 Body mass index (BMI) 26.0-26.9, adult: Secondary | ICD-10-CM | POA: Diagnosis not present

## 2014-10-14 DIAGNOSIS — I10 Essential (primary) hypertension: Secondary | ICD-10-CM | POA: Diagnosis not present

## 2014-10-27 DIAGNOSIS — Z09 Encounter for follow-up examination after completed treatment for conditions other than malignant neoplasm: Secondary | ICD-10-CM | POA: Diagnosis not present

## 2014-10-27 DIAGNOSIS — M19041 Primary osteoarthritis, right hand: Secondary | ICD-10-CM | POA: Diagnosis not present

## 2014-10-27 DIAGNOSIS — M0579 Rheumatoid arthritis with rheumatoid factor of multiple sites without organ or systems involvement: Secondary | ICD-10-CM | POA: Diagnosis not present

## 2014-10-27 DIAGNOSIS — M810 Age-related osteoporosis without current pathological fracture: Secondary | ICD-10-CM | POA: Diagnosis not present

## 2014-11-02 DIAGNOSIS — M810 Age-related osteoporosis without current pathological fracture: Secondary | ICD-10-CM | POA: Diagnosis not present

## 2014-11-02 DIAGNOSIS — Z13820 Encounter for screening for osteoporosis: Secondary | ICD-10-CM | POA: Diagnosis not present

## 2014-11-02 DIAGNOSIS — E2839 Other primary ovarian failure: Secondary | ICD-10-CM | POA: Diagnosis not present

## 2014-11-27 ENCOUNTER — Other Ambulatory Visit (HOSPITAL_COMMUNITY): Payer: Self-pay | Admitting: *Deleted

## 2014-11-30 ENCOUNTER — Ambulatory Visit (HOSPITAL_COMMUNITY)
Admission: RE | Admit: 2014-11-30 | Discharge: 2014-11-30 | Disposition: A | Discharge status: Home / Self Care | Payer: Medicare Other | Source: Ambulatory Visit | Attending: Rheumatology | Admitting: Rheumatology

## 2014-11-30 DIAGNOSIS — M069 Rheumatoid arthritis, unspecified: Secondary | ICD-10-CM | POA: Diagnosis not present

## 2014-11-30 LAB — COMPREHENSIVE METABOLIC PANEL
ALK PHOS: 71 U/L (ref 38–126)
ALT: 19 U/L (ref 14–54)
ANION GAP: 10 (ref 5–15)
AST: 38 U/L (ref 15–41)
Albumin: 3.7 g/dL (ref 3.5–5.0)
BUN: 14 mg/dL (ref 6–20)
CALCIUM: 9.3 mg/dL (ref 8.9–10.3)
CO2: 29 mmol/L (ref 22–32)
Chloride: 101 mmol/L (ref 101–111)
Creatinine, Ser: 0.79 mg/dL (ref 0.44–1.00)
GFR calc non Af Amer: >60 mL/min (ref >60)
Glucose, Bld: 87 mg/dL (ref 65–99)
Potassium: 3.4 mmol/L — ABNORMAL LOW (ref 3.5–5.1)
SODIUM: 140 mmol/L (ref 135–145)
TOTAL PROTEIN: 6.4 g/dL — AB (ref 6.5–8.1)
Total Bilirubin: 0.8 mg/dL (ref 0.3–1.2)

## 2014-11-30 LAB — CBC
HEMATOCRIT: 43 % (ref 36.0–46.0)
HEMOGLOBIN: 14.3 g/dL (ref 12.0–15.0)
MCH: 32 pg (ref 26.0–34.0)
MCHC: 33.3 g/dL (ref 30.0–36.0)
MCV: 96.2 fL (ref 78.0–100.0)
Platelets: 155 10*3/uL (ref 150–400)
RBC: 4.47 MIL/uL (ref 3.87–5.11)
RDW: 13.9 % (ref 11.5–15.5)
WBC: 4.2 10*3/uL (ref 4.0–10.5)

## 2014-11-30 LAB — DIFFERENTIAL
BASOS ABS: 0 10*3/uL (ref 0.0–0.1)
Basophils Relative: 1 %
EOS ABS: 0.2 10*3/uL (ref 0.0–0.7)
EOS PCT: 5 %
Lymphocytes Relative: 28 %
Lymphs Abs: 1.2 10*3/uL (ref 0.7–4.0)
MONO ABS: 0.5 10*3/uL (ref 0.1–1.0)
Monocytes Relative: 13 %
Neutro Abs: 2.3 10*3/uL (ref 1.7–7.7)
Neutrophils Relative %: 53 %

## 2014-11-30 MED ORDER — ACETAMINOPHEN 325 MG PO TABS
650.0000 mg | ORAL_TABLET | ORAL | Status: AC
  Administered 2014-11-30: 650 mg via ORAL

## 2014-11-30 MED ORDER — DIPHENHYDRAMINE HCL 25 MG PO CAPS
25.0000 mg | ORAL_CAPSULE | ORAL | Status: AC
  Administered 2014-11-30: 25 mg via ORAL

## 2014-11-30 MED ORDER — SODIUM CHLORIDE 0.9 % IV SOLN: INTRAVENOUS | Status: DC

## 2014-11-30 MED ORDER — SODIUM CHLORIDE 0.9 % IV SOLN
140.0000 mg | INTRAVENOUS | Status: AC
  Administered 2014-11-30: 140 mg via INTRAVENOUS
  Filled 2014-11-30: qty 11.2

## 2014-11-30 MED ORDER — ACETAMINOPHEN 325 MG PO TABS
ORAL_TABLET | ORAL | Status: AC
  Administered 2014-11-30: 650 mg via ORAL
  Filled 2014-11-30: qty 2

## 2014-11-30 MED ORDER — DIPHENHYDRAMINE HCL 25 MG PO CAPS
ORAL_CAPSULE | ORAL | Status: AC
  Administered 2014-11-30: 25 mg via ORAL
  Filled 2014-11-30: qty 1

## 2014-12-14 DIAGNOSIS — Z79899 Other long term (current) drug therapy: Secondary | ICD-10-CM | POA: Diagnosis not present

## 2014-12-28 DIAGNOSIS — Z23 Encounter for immunization: Secondary | ICD-10-CM | POA: Diagnosis not present

## 2014-12-29 ENCOUNTER — Other Ambulatory Visit: Payer: Self-pay

## 2014-12-29 MED ORDER — MEMANTINE HCL 10 MG PO TABS: 10.0000 mg | ORAL_TABLET | Freq: Two times a day (BID) | ORAL | Status: DC

## 2014-12-29 MED ORDER — DONEPEZIL HCL 10 MG PO TABS: 10.0000 mg | ORAL_TABLET | Freq: Every day | ORAL | Status: DC

## 2015-01-22 ENCOUNTER — Other Ambulatory Visit (HOSPITAL_COMMUNITY): Payer: Self-pay | Admitting: *Deleted

## 2015-01-25 ENCOUNTER — Encounter (HOSPITAL_COMMUNITY)
Admission: RE | Admit: 2015-01-25 | Discharge: 2015-01-25 | Disposition: A | Discharge status: Home / Self Care | Payer: Medicare Other | Source: Ambulatory Visit | Attending: Rheumatology | Admitting: Rheumatology

## 2015-01-25 DIAGNOSIS — M069 Rheumatoid arthritis, unspecified: Secondary | ICD-10-CM | POA: Diagnosis not present

## 2015-01-25 LAB — COMPREHENSIVE METABOLIC PANEL
ALBUMIN: 3.7 g/dL (ref 3.5–5.0)
ALK PHOS: 65 U/L (ref 38–126)
ALT: 15 U/L (ref 14–54)
ANION GAP: 7 (ref 5–15)
AST: 31 U/L (ref 15–41)
BILIRUBIN TOTAL: 1 mg/dL (ref 0.3–1.2)
BUN: 17 mg/dL (ref 6–20)
CALCIUM: 9.1 mg/dL (ref 8.9–10.3)
CO2: 29 mmol/L (ref 22–32)
CREATININE: 0.77 mg/dL (ref 0.44–1.00)
Chloride: 103 mmol/L (ref 101–111)
GFR calc Af Amer: >60 mL/min (ref >60)
GFR calc non Af Amer: >60 mL/min (ref >60)
GLUCOSE: 95 mg/dL (ref 65–99)
Potassium: 3.8 mmol/L (ref 3.5–5.1)
Sodium: 139 mmol/L (ref 135–145)
TOTAL PROTEIN: 5.9 g/dL — AB (ref 6.5–8.1)

## 2015-01-25 LAB — CBC WITH DIFFERENTIAL/PLATELET
BASOS PCT: 1 %
Basophils Absolute: 0.1 10*3/uL (ref 0.0–0.1)
Eosinophils Absolute: 0.1 10*3/uL (ref 0.0–0.7)
Eosinophils Relative: 3 %
HEMATOCRIT: 40.6 % (ref 36.0–46.0)
HEMOGLOBIN: 13.5 g/dL (ref 12.0–15.0)
LYMPHS ABS: 1.2 10*3/uL (ref 0.7–4.0)
Lymphocytes Relative: 32 %
MCH: 31.8 pg (ref 26.0–34.0)
MCHC: 33.3 g/dL (ref 30.0–36.0)
MCV: 95.5 fL (ref 78.0–100.0)
MONOS PCT: 9 %
Monocytes Absolute: 0.4 10*3/uL (ref 0.1–1.0)
NEUTROS ABS: 2.1 10*3/uL (ref 1.7–7.7)
NEUTROS PCT: 55 %
Platelets: 150 10*3/uL (ref 150–400)
RBC: 4.25 MIL/uL (ref 3.87–5.11)
RDW: 13.9 % (ref 11.5–15.5)
WBC: 3.8 10*3/uL — ABNORMAL LOW (ref 4.0–10.5)

## 2015-01-25 MED ORDER — DIPHENHYDRAMINE HCL 25 MG PO CAPS
25.0000 mg | ORAL_CAPSULE | ORAL | Status: DC
  Administered 2015-01-25: 25 mg via ORAL

## 2015-01-25 MED ORDER — DIPHENHYDRAMINE HCL 25 MG PO CAPS
ORAL_CAPSULE | ORAL | Status: AC
  Filled 2015-01-25: qty 1

## 2015-01-25 MED ORDER — SODIUM CHLORIDE 0.9 % IV SOLN
INTRAVENOUS | Status: DC
  Administered 2015-01-25: 10:00:00 via INTRAVENOUS

## 2015-01-25 MED ORDER — GOLIMUMAB 50 MG/4ML IV SOLN
140.0000 mg | INTRAVENOUS | Status: DC
  Administered 2015-01-25: 140 mg via INTRAVENOUS
  Filled 2015-01-25: qty 11.2

## 2015-01-25 MED ORDER — ACETAMINOPHEN 325 MG PO TABS
ORAL_TABLET | ORAL | Status: AC
  Filled 2015-01-25: qty 2

## 2015-01-25 MED ORDER — ACETAMINOPHEN 325 MG PO TABS
650.0000 mg | ORAL_TABLET | ORAL | Status: DC
  Administered 2015-01-25: 650 mg via ORAL

## 2015-03-16 DIAGNOSIS — H35363 Drusen (degenerative) of macula, bilateral: Secondary | ICD-10-CM | POA: Diagnosis not present

## 2015-03-16 DIAGNOSIS — H353222 Exudative age-related macular degeneration, left eye, with inactive choroidal neovascularization: Secondary | ICD-10-CM | POA: Diagnosis not present

## 2015-03-16 DIAGNOSIS — H353134 Nonexudative age-related macular degeneration, bilateral, advanced atrophic with subfoveal involvement: Secondary | ICD-10-CM | POA: Diagnosis not present

## 2015-03-17 ENCOUNTER — Other Ambulatory Visit: Payer: Self-pay | Admitting: Diagnostic Neuroimaging

## 2015-03-19 DIAGNOSIS — H353114 Nonexudative age-related macular degeneration, right eye, advanced atrophic with subfoveal involvement: Secondary | ICD-10-CM | POA: Diagnosis not present

## 2015-03-19 DIAGNOSIS — H40009 Preglaucoma, unspecified, unspecified eye: Secondary | ICD-10-CM | POA: Diagnosis not present

## 2015-03-19 DIAGNOSIS — H5200 Hypermetropia, unspecified eye: Secondary | ICD-10-CM | POA: Diagnosis not present

## 2015-03-22 ENCOUNTER — Encounter (HOSPITAL_COMMUNITY)
Admission: RE | Admit: 2015-03-22 | Discharge: 2015-03-22 | Disposition: A | Discharge status: Home / Self Care | Payer: Medicare Other | Source: Ambulatory Visit | Attending: Rheumatology | Admitting: Rheumatology

## 2015-03-22 DIAGNOSIS — M069 Rheumatoid arthritis, unspecified: Secondary | ICD-10-CM | POA: Insufficient documentation

## 2015-03-22 LAB — COMPREHENSIVE METABOLIC PANEL
ALK PHOS: 63 U/L (ref 38–126)
ALT: 15 U/L (ref 14–54)
AST: 27 U/L (ref 15–41)
Albumin: 3.5 g/dL (ref 3.5–5.0)
Anion gap: 10 (ref 5–15)
BUN: 15 mg/dL (ref 6–20)
CALCIUM: 8.9 mg/dL (ref 8.9–10.3)
CHLORIDE: 104 mmol/L (ref 101–111)
CO2: 28 mmol/L (ref 22–32)
CREATININE: 0.74 mg/dL (ref 0.44–1.00)
Glucose, Bld: 86 mg/dL (ref 65–99)
Potassium: 3.5 mmol/L (ref 3.5–5.1)
Sodium: 142 mmol/L (ref 135–145)
Total Bilirubin: 0.7 mg/dL (ref 0.3–1.2)
Total Protein: 5.7 g/dL — ABNORMAL LOW (ref 6.5–8.1)

## 2015-03-22 LAB — CBC WITH DIFFERENTIAL/PLATELET
Basophils Absolute: 0.1 10*3/uL (ref 0.0–0.1)
Basophils Relative: 1 %
EOS PCT: 4 %
Eosinophils Absolute: 0.2 10*3/uL (ref 0.0–0.7)
HCT: 39.1 % (ref 36.0–46.0)
Hemoglobin: 13.1 g/dL (ref 12.0–15.0)
LYMPHS ABS: 1.4 10*3/uL (ref 0.7–4.0)
LYMPHS PCT: 27 %
MCH: 32.6 pg (ref 26.0–34.0)
MCHC: 33.5 g/dL (ref 30.0–36.0)
MCV: 97.3 fL (ref 78.0–100.0)
MONOS PCT: 12 %
Monocytes Absolute: 0.7 10*3/uL (ref 0.1–1.0)
Neutro Abs: 3 10*3/uL (ref 1.7–7.7)
Neutrophils Relative %: 56 %
PLATELETS: 150 10*3/uL (ref 150–400)
RBC: 4.02 MIL/uL (ref 3.87–5.11)
RDW: 14 % (ref 11.5–15.5)
WBC: 5.3 10*3/uL (ref 4.0–10.5)

## 2015-03-22 MED ORDER — SODIUM CHLORIDE 0.9 % IV SOLN
INTRAVENOUS | Status: DC
  Administered 2015-03-22: 10:00:00 via INTRAVENOUS

## 2015-03-22 MED ORDER — ACETAMINOPHEN 325 MG PO TABS
ORAL_TABLET | ORAL | Status: AC
  Administered 2015-03-22: 650 mg
  Filled 2015-03-22: qty 2

## 2015-03-22 MED ORDER — SODIUM CHLORIDE 0.9 % IV SOLN
140.0000 mg | INTRAVENOUS | Status: DC
  Administered 2015-03-22: 140 mg via INTRAVENOUS
  Filled 2015-03-22: qty 11.2

## 2015-03-22 MED ORDER — DIPHENHYDRAMINE HCL 25 MG PO CAPS: 25.0000 mg | ORAL_CAPSULE | ORAL | Status: DC

## 2015-03-22 MED ORDER — DIPHENHYDRAMINE HCL 25 MG PO CAPS
ORAL_CAPSULE | ORAL | Status: AC
  Administered 2015-03-22: 25 mg
  Filled 2015-03-22: qty 1

## 2015-03-22 MED ORDER — ACETAMINOPHEN 325 MG PO TABS: 650.0000 mg | ORAL_TABLET | ORAL | Status: DC

## 2015-03-24 LAB — QUANTIFERON IN TUBE
QFT TB AG MINUS NIL VALUE: 0.02 IU/mL
QUANTIFERON MITOGEN VALUE: >10 IU/mL
QUANTIFERON TB AG VALUE: 0.13 IU/mL
QUANTIFERON TB GOLD: NEGATIVE
Quantiferon Nil Value: 0.11 IU/mL

## 2015-03-24 LAB — QUANTIFERON TB GOLD ASSAY (BLOOD)

## 2015-03-29 DIAGNOSIS — M810 Age-related osteoporosis without current pathological fracture: Secondary | ICD-10-CM | POA: Diagnosis not present

## 2015-03-29 DIAGNOSIS — M19241 Secondary osteoarthritis, right hand: Secondary | ICD-10-CM | POA: Diagnosis not present

## 2015-03-29 DIAGNOSIS — M0579 Rheumatoid arthritis with rheumatoid factor of multiple sites without organ or systems involvement: Secondary | ICD-10-CM | POA: Diagnosis not present

## 2015-03-29 DIAGNOSIS — Z09 Encounter for follow-up examination after completed treatment for conditions other than malignant neoplasm: Secondary | ICD-10-CM | POA: Diagnosis not present

## 2015-04-16 DIAGNOSIS — Z79899 Other long term (current) drug therapy: Secondary | ICD-10-CM | POA: Diagnosis not present

## 2015-04-16 DIAGNOSIS — E7800 Pure hypercholesterolemia, unspecified: Secondary | ICD-10-CM | POA: Diagnosis not present

## 2015-04-20 DIAGNOSIS — Z1389 Encounter for screening for other disorder: Secondary | ICD-10-CM | POA: Diagnosis not present

## 2015-04-20 DIAGNOSIS — F329 Major depressive disorder, single episode, unspecified: Secondary | ICD-10-CM | POA: Diagnosis not present

## 2015-04-20 DIAGNOSIS — Z6826 Body mass index (BMI) 26.0-26.9, adult: Secondary | ICD-10-CM | POA: Diagnosis not present

## 2015-04-20 DIAGNOSIS — E7800 Pure hypercholesterolemia, unspecified: Secondary | ICD-10-CM | POA: Diagnosis not present

## 2015-04-20 DIAGNOSIS — I10 Essential (primary) hypertension: Secondary | ICD-10-CM | POA: Diagnosis not present

## 2015-04-20 DIAGNOSIS — R413 Other amnesia: Secondary | ICD-10-CM | POA: Diagnosis not present

## 2015-04-20 DIAGNOSIS — D509 Iron deficiency anemia, unspecified: Secondary | ICD-10-CM | POA: Diagnosis not present

## 2015-05-07 ENCOUNTER — Other Ambulatory Visit: Payer: Self-pay | Admitting: Diagnostic Neuroimaging

## 2015-05-14 ENCOUNTER — Other Ambulatory Visit (HOSPITAL_COMMUNITY): Payer: Self-pay | Admitting: *Deleted

## 2015-05-17 ENCOUNTER — Ambulatory Visit (HOSPITAL_COMMUNITY)
Admission: RE | Admit: 2015-05-17 | Discharge: 2015-05-17 | Disposition: A | Discharge status: Home / Self Care | Payer: Medicare Other | Source: Ambulatory Visit | Attending: Rheumatology | Admitting: Rheumatology

## 2015-05-17 DIAGNOSIS — M069 Rheumatoid arthritis, unspecified: Secondary | ICD-10-CM | POA: Insufficient documentation

## 2015-05-17 DIAGNOSIS — Z79899 Other long term (current) drug therapy: Secondary | ICD-10-CM | POA: Insufficient documentation

## 2015-05-17 LAB — CBC WITH DIFFERENTIAL/PLATELET
BASOS PCT: 1 %
Basophils Absolute: 0 10*3/uL (ref 0.0–0.1)
EOS ABS: 0.2 10*3/uL (ref 0.0–0.7)
EOS PCT: 3 %
HEMATOCRIT: 40.6 % (ref 36.0–46.0)
Hemoglobin: 13.8 g/dL (ref 12.0–15.0)
LYMPHS PCT: 28 %
Lymphs Abs: 1.4 10*3/uL (ref 0.7–4.0)
MCH: 32.7 pg (ref 26.0–34.0)
MCHC: 34 g/dL (ref 30.0–36.0)
MCV: 96.2 fL (ref 78.0–100.0)
MONO ABS: 0.5 10*3/uL (ref 0.1–1.0)
Monocytes Relative: 10 %
Neutro Abs: 2.8 10*3/uL (ref 1.7–7.7)
Neutrophils Relative %: 58 %
Platelets: 145 10*3/uL — ABNORMAL LOW (ref 150–400)
RBC: 4.22 MIL/uL (ref 3.87–5.11)
RDW: 13.6 % (ref 11.5–15.5)
WBC: 4.9 10*3/uL (ref 4.0–10.5)

## 2015-05-17 LAB — COMPREHENSIVE METABOLIC PANEL
ALBUMIN: 3.7 g/dL (ref 3.5–5.0)
ALT: 13 U/L — ABNORMAL LOW (ref 14–54)
ANION GAP: 10 (ref 5–15)
AST: 28 U/L (ref 15–41)
Alkaline Phosphatase: 64 U/L (ref 38–126)
BILIRUBIN TOTAL: 0.9 mg/dL (ref 0.3–1.2)
BUN: 12 mg/dL (ref 6–20)
CHLORIDE: 102 mmol/L (ref 101–111)
CO2: 28 mmol/L (ref 22–32)
Calcium: 8.9 mg/dL (ref 8.9–10.3)
Creatinine, Ser: 0.8 mg/dL (ref 0.44–1.00)
GFR calc Af Amer: >60 mL/min (ref >60)
GLUCOSE: 97 mg/dL (ref 65–99)
POTASSIUM: 3.5 mmol/L (ref 3.5–5.1)
Sodium: 140 mmol/L (ref 135–145)
TOTAL PROTEIN: 6.1 g/dL — AB (ref 6.5–8.1)

## 2015-05-17 MED ORDER — SODIUM CHLORIDE 0.9 % IV SOLN
INTRAVENOUS | Status: DC
  Administered 2015-05-17: 10:00:00 via INTRAVENOUS

## 2015-05-17 MED ORDER — DIPHENHYDRAMINE HCL 25 MG PO CAPS: 50.0000 mg | ORAL_CAPSULE | ORAL | Status: DC

## 2015-05-17 MED ORDER — SODIUM CHLORIDE 0.9 % IV SOLN
140.0000 mg | INTRAVENOUS | Status: DC
  Administered 2015-05-17: 140 mg via INTRAVENOUS
  Filled 2015-05-17: qty 11.2

## 2015-05-17 MED ORDER — ACETAMINOPHEN 325 MG PO TABS: 650.0000 mg | ORAL_TABLET | ORAL | Status: DC

## 2015-06-21 DIAGNOSIS — Z79899 Other long term (current) drug therapy: Secondary | ICD-10-CM | POA: Diagnosis not present

## 2015-07-09 ENCOUNTER — Other Ambulatory Visit (HOSPITAL_COMMUNITY): Payer: Self-pay | Admitting: *Deleted

## 2015-07-12 ENCOUNTER — Ambulatory Visit (HOSPITAL_COMMUNITY)
Admission: RE | Admit: 2015-07-12 | Discharge: 2015-07-12 | Disposition: A | Discharge status: Home / Self Care | Payer: Medicare Other | Source: Ambulatory Visit | Attending: Rheumatology | Admitting: Rheumatology

## 2015-07-12 DIAGNOSIS — M069 Rheumatoid arthritis, unspecified: Secondary | ICD-10-CM | POA: Diagnosis not present

## 2015-07-12 LAB — COMPREHENSIVE METABOLIC PANEL
ALBUMIN: 3.6 g/dL (ref 3.5–5.0)
ALT: 15 U/L (ref 14–54)
AST: 34 U/L (ref 15–41)
Alkaline Phosphatase: 67 U/L (ref 38–126)
Anion gap: 9 (ref 5–15)
BUN: 10 mg/dL (ref 6–20)
CHLORIDE: 102 mmol/L (ref 101–111)
CO2: 30 mmol/L (ref 22–32)
CREATININE: 0.83 mg/dL (ref 0.44–1.00)
Calcium: 9.3 mg/dL (ref 8.9–10.3)
Glucose, Bld: 91 mg/dL (ref 65–99)
POTASSIUM: 3.6 mmol/L (ref 3.5–5.1)
SODIUM: 141 mmol/L (ref 135–145)
Total Bilirubin: 0.9 mg/dL (ref 0.3–1.2)
Total Protein: 6.7 g/dL (ref 6.5–8.1)

## 2015-07-12 LAB — CBC WITH DIFFERENTIAL/PLATELET
BASOS PCT: 1 %
Basophils Absolute: 0.1 10*3/uL (ref 0.0–0.1)
EOS ABS: 0.3 10*3/uL (ref 0.0–0.7)
Eosinophils Relative: 7 %
HCT: 41.1 % (ref 36.0–46.0)
Hemoglobin: 13.6 g/dL (ref 12.0–15.0)
Lymphocytes Relative: 35 %
Lymphs Abs: 1.5 10*3/uL (ref 0.7–4.0)
MCH: 31.3 pg (ref 26.0–34.0)
MCHC: 33.1 g/dL (ref 30.0–36.0)
MCV: 94.5 fL (ref 78.0–100.0)
MONO ABS: 0.5 10*3/uL (ref 0.1–1.0)
MONOS PCT: 12 %
Neutro Abs: 1.9 10*3/uL (ref 1.7–7.7)
Neutrophils Relative %: 45 %
Platelets: 164 10*3/uL (ref 150–400)
RBC: 4.35 MIL/uL (ref 3.87–5.11)
RDW: 13.8 % (ref 11.5–15.5)
WBC: 4.3 10*3/uL (ref 4.0–10.5)

## 2015-07-12 MED ORDER — ACETAMINOPHEN 325 MG PO TABS
ORAL_TABLET | ORAL | Status: AC
  Filled 2015-07-12: qty 2

## 2015-07-12 MED ORDER — SODIUM CHLORIDE 0.9 % IV SOLN
140.0000 mg | INTRAVENOUS | Status: DC
  Administered 2015-07-12: 140 mg via INTRAVENOUS
  Filled 2015-07-12: qty 11.2

## 2015-07-12 MED ORDER — ACETAMINOPHEN 325 MG PO TABS
650.0000 mg | ORAL_TABLET | ORAL | Status: DC
  Administered 2015-07-12: 650 mg via ORAL

## 2015-07-12 MED ORDER — DIPHENHYDRAMINE HCL 25 MG PO CAPS: 50.0000 mg | ORAL_CAPSULE | ORAL | Status: DC

## 2015-07-12 MED ORDER — SODIUM CHLORIDE 0.9 % IV SOLN
INTRAVENOUS | Status: DC
  Administered 2015-07-12: 10:00:00 via INTRAVENOUS

## 2015-07-13 ENCOUNTER — Other Ambulatory Visit: Payer: Self-pay | Admitting: Diagnostic Neuroimaging

## 2015-07-28 DIAGNOSIS — Z09 Encounter for follow-up examination after completed treatment for conditions other than malignant neoplasm: Secondary | ICD-10-CM | POA: Diagnosis not present

## 2015-07-28 DIAGNOSIS — M79641 Pain in right hand: Secondary | ICD-10-CM | POA: Diagnosis not present

## 2015-07-28 DIAGNOSIS — M79672 Pain in left foot: Secondary | ICD-10-CM | POA: Diagnosis not present

## 2015-07-28 DIAGNOSIS — M79671 Pain in right foot: Secondary | ICD-10-CM | POA: Diagnosis not present

## 2015-07-28 DIAGNOSIS — M25561 Pain in right knee: Secondary | ICD-10-CM | POA: Diagnosis not present

## 2015-07-28 DIAGNOSIS — M79642 Pain in left hand: Secondary | ICD-10-CM | POA: Diagnosis not present

## 2015-07-28 DIAGNOSIS — M0579 Rheumatoid arthritis with rheumatoid factor of multiple sites without organ or systems involvement: Secondary | ICD-10-CM | POA: Diagnosis not present

## 2015-08-30 ENCOUNTER — Telehealth: Payer: Self-pay | Admitting: *Deleted

## 2015-08-30 MED ORDER — DONEPEZIL HCL 10 MG PO TABS: 10.0000 mg | ORAL_TABLET | Freq: Every day | ORAL | Status: DC

## 2015-08-30 NOTE — Telephone Encounter (Signed)
Spoke with son, Ritchie to clarify pharmacy. He stated his mom is going to spend 1 month with sister out of town and needs refill on Aricept. He requested the one month refill be sent to local pharmacy, [place] and 3 month supply sent to [facility]. He stated that at her last appointment Dr Leta Baptist had stated her PCP could manage her medications. Ritchie stated patient's PCP stated she was unaware of needing to refill dementia medications. Informed him this RN will refill as he has requested. He verbalized understanding, appreciation.

## 2015-08-30 NOTE — Telephone Encounter (Signed)
Pt son called requesting refill on donepezil (ARICEPT) 10 MG tablet. Please advise son cell # [phone]

## 2015-09-03 ENCOUNTER — Other Ambulatory Visit (HOSPITAL_COMMUNITY): Payer: Self-pay | Admitting: *Deleted

## 2015-09-06 ENCOUNTER — Ambulatory Visit (HOSPITAL_COMMUNITY)
Admission: RE | Admit: 2015-09-06 | Discharge: 2015-09-06 | Disposition: A | Discharge status: Home / Self Care | Payer: Medicare Other | Source: Ambulatory Visit | Attending: Rheumatology | Admitting: Rheumatology

## 2015-09-06 DIAGNOSIS — M069 Rheumatoid arthritis, unspecified: Secondary | ICD-10-CM | POA: Insufficient documentation

## 2015-09-06 LAB — CBC WITH DIFFERENTIAL/PLATELET
Basophils Absolute: 0 10*3/uL (ref 0.0–0.1)
Basophils Relative: 1 %
EOS ABS: 0.1 10*3/uL (ref 0.0–0.7)
EOS PCT: 3 %
HCT: 41.1 % (ref 36.0–46.0)
Hemoglobin: 13.5 g/dL (ref 12.0–15.0)
LYMPHS ABS: 1.4 10*3/uL (ref 0.7–4.0)
LYMPHS PCT: 30 %
MCH: 31.4 pg (ref 26.0–34.0)
MCHC: 32.8 g/dL (ref 30.0–36.0)
MCV: 95.6 fL (ref 78.0–100.0)
MONO ABS: 0.4 10*3/uL (ref 0.1–1.0)
Monocytes Relative: 9 %
Neutro Abs: 2.6 10*3/uL (ref 1.7–7.7)
Neutrophils Relative %: 57 %
PLATELETS: 169 10*3/uL (ref 150–400)
RBC: 4.3 MIL/uL (ref 3.87–5.11)
RDW: 14.1 % (ref 11.5–15.5)
WBC: 4.5 10*3/uL (ref 4.0–10.5)

## 2015-09-06 LAB — COMPREHENSIVE METABOLIC PANEL
ALT: 14 U/L (ref 14–54)
ANION GAP: 7 (ref 5–15)
AST: 28 U/L (ref 15–41)
Albumin: 3.8 g/dL (ref 3.5–5.0)
Alkaline Phosphatase: 69 U/L (ref 38–126)
BUN: 10 mg/dL (ref 6–20)
CHLORIDE: 102 mmol/L (ref 101–111)
CO2: 30 mmol/L (ref 22–32)
Calcium: 9.2 mg/dL (ref 8.9–10.3)
Creatinine, Ser: 0.7 mg/dL (ref 0.44–1.00)
Glucose, Bld: 91 mg/dL (ref 65–99)
POTASSIUM: 3.3 mmol/L — AB (ref 3.5–5.1)
SODIUM: 139 mmol/L (ref 135–145)
Total Bilirubin: 0.7 mg/dL (ref 0.3–1.2)
Total Protein: 6.1 g/dL — ABNORMAL LOW (ref 6.5–8.1)

## 2015-09-06 MED ORDER — DIPHENHYDRAMINE HCL 25 MG PO CAPS: 50.0000 mg | ORAL_CAPSULE | ORAL | Status: DC

## 2015-09-06 MED ORDER — SODIUM CHLORIDE 0.9 % IV SOLN: INTRAVENOUS | Status: DC

## 2015-09-06 MED ORDER — ACETAMINOPHEN 325 MG PO TABS
ORAL_TABLET | ORAL | Status: AC
  Filled 2015-09-06: qty 2

## 2015-09-06 MED ORDER — ACETAMINOPHEN 325 MG PO TABS
650.0000 mg | ORAL_TABLET | ORAL | Status: AC
Start: 2015-09-06 — End: 2015-09-06
  Administered 2015-09-06: 650 mg via ORAL

## 2015-09-06 MED ORDER — GOLIMUMAB 50 MG/4ML IV SOLN
140.0000 mg | INTRAVENOUS | Status: DC
  Administered 2015-09-06: 140 mg via INTRAVENOUS
  Filled 2015-09-06: qty 11.2

## 2015-09-16 ENCOUNTER — Other Ambulatory Visit: Payer: Self-pay | Admitting: Diagnostic Neuroimaging

## 2015-09-24 ENCOUNTER — Other Ambulatory Visit: Payer: Self-pay | Admitting: Diagnostic Neuroimaging

## 2015-10-14 ENCOUNTER — Other Ambulatory Visit: Payer: Self-pay | Admitting: Diagnostic Neuroimaging

## 2015-10-14 ENCOUNTER — Telehealth: Payer: Self-pay | Admitting: Diagnostic Neuroimaging

## 2015-10-14 MED ORDER — DONEPEZIL HCL 10 MG PO TABS: 10.0000 mg | ORAL_TABLET | Freq: Every day | ORAL | 0 refills | Status: AC

## 2015-10-14 MED ORDER — MEMANTINE HCL 10 MG PO TABS: 10.0000 mg | ORAL_TABLET | Freq: Two times a day (BID) | ORAL | 0 refills | Status: DC

## 2015-10-14 NOTE — Telephone Encounter (Signed)
Son called to request Rx for memantine (NAMENDA) 10 MG tablet and donepezil (ARICEPT) 10 MG tablet to [facility] for enough to hold patient until new Rx's from [facility] come in, states [facility] was to fax our office regarding these Rx's, states the Rx's have expired and need to be renewed.

## 2015-10-14 NOTE — Telephone Encounter (Signed)
Refills sent for one month, [facility] [place].

## 2015-10-25 DIAGNOSIS — M0579 Rheumatoid arthritis with rheumatoid factor of multiple sites without organ or systems involvement: Secondary | ICD-10-CM | POA: Insufficient documentation

## 2015-10-26 DIAGNOSIS — Z6825 Body mass index (BMI) 25.0-25.9, adult: Secondary | ICD-10-CM | POA: Diagnosis not present

## 2015-10-26 DIAGNOSIS — E7800 Pure hypercholesterolemia, unspecified: Secondary | ICD-10-CM | POA: Diagnosis not present

## 2015-10-26 DIAGNOSIS — Z79899 Other long term (current) drug therapy: Secondary | ICD-10-CM | POA: Diagnosis not present

## 2015-10-26 DIAGNOSIS — R413 Other amnesia: Secondary | ICD-10-CM | POA: Diagnosis not present

## 2015-10-26 DIAGNOSIS — Z9181 History of falling: Secondary | ICD-10-CM | POA: Diagnosis not present

## 2015-10-26 DIAGNOSIS — F329 Major depressive disorder, single episode, unspecified: Secondary | ICD-10-CM | POA: Diagnosis not present

## 2015-10-26 DIAGNOSIS — I10 Essential (primary) hypertension: Secondary | ICD-10-CM | POA: Diagnosis not present

## 2015-10-26 DIAGNOSIS — D509 Iron deficiency anemia, unspecified: Secondary | ICD-10-CM | POA: Diagnosis not present

## 2015-11-01 ENCOUNTER — Telehealth: Payer: Self-pay | Admitting: Diagnostic Neuroimaging

## 2015-11-01 ENCOUNTER — Ambulatory Visit (HOSPITAL_COMMUNITY)
Admission: RE | Admit: 2015-11-01 | Discharge: 2015-11-01 | Disposition: A | Discharge status: Home / Self Care | Payer: Medicare Other | Source: Ambulatory Visit | Attending: Rheumatology | Admitting: Rheumatology

## 2015-11-01 DIAGNOSIS — M069 Rheumatoid arthritis, unspecified: Secondary | ICD-10-CM | POA: Diagnosis not present

## 2015-11-01 LAB — COMPREHENSIVE METABOLIC PANEL
ALT: 12 U/L — ABNORMAL LOW (ref 14–54)
ANION GAP: 9 (ref 5–15)
AST: 25 U/L (ref 15–41)
Albumin: 4.1 g/dL (ref 3.5–5.0)
Alkaline Phosphatase: 63 U/L (ref 38–126)
BILIRUBIN TOTAL: 0.9 mg/dL (ref 0.3–1.2)
BUN: 10 mg/dL (ref 6–20)
CO2: 29 mmol/L (ref 22–32)
Calcium: 9.9 mg/dL (ref 8.9–10.3)
Chloride: 102 mmol/L (ref 101–111)
Creatinine, Ser: 0.73 mg/dL (ref 0.44–1.00)
GFR calc Af Amer: >60 mL/min (ref >60)
Glucose, Bld: 95 mg/dL (ref 65–99)
POTASSIUM: 4.1 mmol/L (ref 3.5–5.1)
Sodium: 140 mmol/L (ref 135–145)
TOTAL PROTEIN: 6.2 g/dL — AB (ref 6.5–8.1)

## 2015-11-01 LAB — CBC WITH DIFFERENTIAL/PLATELET
Basophils Absolute: 0.1 10*3/uL (ref 0.0–0.1)
Basophils Relative: 1 %
Eosinophils Absolute: 0.1 10*3/uL (ref 0.0–0.7)
Eosinophils Relative: 3 %
HEMATOCRIT: 42.2 % (ref 36.0–46.0)
Hemoglobin: 13.5 g/dL (ref 12.0–15.0)
LYMPHS PCT: 28 %
Lymphs Abs: 1.3 10*3/uL (ref 0.7–4.0)
MCH: 31.3 pg (ref 26.0–34.0)
MCHC: 32 g/dL (ref 30.0–36.0)
MCV: 97.7 fL (ref 78.0–100.0)
Monocytes Absolute: 0.6 10*3/uL (ref 0.1–1.0)
Monocytes Relative: 13 %
NEUTROS PCT: 55 %
Neutro Abs: 2.6 10*3/uL (ref 1.7–7.7)
Platelets: 186 10*3/uL (ref 150–400)
RBC: 4.32 MIL/uL (ref 3.87–5.11)
RDW: 14.1 % (ref 11.5–15.5)
WBC: 4.6 10*3/uL (ref 4.0–10.5)

## 2015-11-01 MED ORDER — SODIUM CHLORIDE 0.9 % IV SOLN
140.0000 mg | INTRAVENOUS | Status: AC
  Administered 2015-11-01: 140 mg via INTRAVENOUS
  Filled 2015-11-01: qty 3.2

## 2015-11-01 MED ORDER — MEMANTINE HCL 10 MG PO TABS: 10.0000 mg | ORAL_TABLET | Freq: Two times a day (BID) | ORAL | 3 refills | Status: AC

## 2015-11-01 MED ORDER — ACETAMINOPHEN 325 MG PO TABS: 650.0000 mg | ORAL_TABLET | ORAL | Status: DC

## 2015-11-01 MED ORDER — SODIUM CHLORIDE 0.9 % IV SOLN
INTRAVENOUS | Status: AC
  Administered 2015-11-01: 10:00:00 via INTRAVENOUS

## 2015-11-01 NOTE — Telephone Encounter (Signed)
Refill sent to [facility] as requested by son.

## 2015-11-01 NOTE — Telephone Encounter (Signed)
Pt's son called inquiring if memantine (NAMENDA) 10 MG tablet has been sent to [facility]. They are telling him they haven't rec'd a RX. Please call him

## 2015-11-10 ENCOUNTER — Other Ambulatory Visit: Payer: Self-pay | Admitting: Diagnostic Neuroimaging

## 2015-11-10 NOTE — Telephone Encounter (Signed)
LVM with son , Ritchie requesting he call back to clarify whether his mother needs [facility]ills [facility], who sent [facility]ill requests to this office electronically, instead of [facility]. Left name, number.

## 2015-11-10 NOTE — Telephone Encounter (Signed)
Received call back from son, who clarified that his mother is getting all prescriptions form [facility]. [facility] only use [facility]. He advised to disregard the two refill requests this office received from [facility]. He verbalized understanding, appreciation of call.

## 2015-11-30 ENCOUNTER — Ambulatory Visit (INDEPENDENT_AMBULATORY_CARE_PROVIDER_SITE_OTHER): Payer: Medicare Other | Admitting: Rheumatology

## 2015-11-30 DIAGNOSIS — M25561 Pain in right knee: Secondary | ICD-10-CM | POA: Diagnosis not present

## 2015-11-30 DIAGNOSIS — M79641 Pain in right hand: Secondary | ICD-10-CM | POA: Diagnosis not present

## 2015-11-30 DIAGNOSIS — M25562 Pain in left knee: Secondary | ICD-10-CM

## 2015-11-30 DIAGNOSIS — M5137 Other intervertebral disc degeneration, lumbosacral region: Secondary | ICD-10-CM

## 2015-11-30 DIAGNOSIS — M79642 Pain in left hand: Secondary | ICD-10-CM

## 2015-12-14 DIAGNOSIS — H35363 Drusen (degenerative) of macula, bilateral: Secondary | ICD-10-CM | POA: Diagnosis not present

## 2015-12-14 DIAGNOSIS — H35361 Drusen (degenerative) of macula, right eye: Secondary | ICD-10-CM | POA: Diagnosis not present

## 2015-12-14 DIAGNOSIS — H353222 Exudative age-related macular degeneration, left eye, with inactive choroidal neovascularization: Secondary | ICD-10-CM | POA: Diagnosis not present

## 2015-12-14 DIAGNOSIS — H353134 Nonexudative age-related macular degeneration, bilateral, advanced atrophic with subfoveal involvement: Secondary | ICD-10-CM | POA: Diagnosis not present

## 2015-12-16 ENCOUNTER — Other Ambulatory Visit: Payer: Self-pay | Admitting: Radiology

## 2015-12-16 ENCOUNTER — Telehealth: Payer: Self-pay | Admitting: Radiology

## 2015-12-16 DIAGNOSIS — M0579 Rheumatoid arthritis with rheumatoid factor of multiple sites without organ or systems involvement: Secondary | ICD-10-CM

## 2015-12-16 MED ORDER — SODIUM CHLORIDE 0.9 % IV SOLN: 2.0000 mg/kg | INTRAVENOUS | Status: DC

## 2015-12-16 NOTE — Telephone Encounter (Signed)
Error

## 2015-12-17 MED ORDER — GOLIMUMAB 50 MG/4ML IV SOLN: 2.0000 mg/kg | INTRAVENOUS | Status: AC

## 2015-12-17 MED ORDER — DIPHENHYDRAMINE HCL 25 MG PO CAPS: 25.0000 mg | ORAL_CAPSULE | Freq: Once | ORAL | Status: AC

## 2015-12-17 MED ORDER — ACETAMINOPHEN 325 MG PO TABS: 650.0000 mg | ORAL_TABLET | Freq: Once | ORAL | Status: AC

## 2015-12-22 DIAGNOSIS — Z23 Encounter for immunization: Secondary | ICD-10-CM | POA: Diagnosis not present

## 2015-12-24 ENCOUNTER — Telehealth: Payer: Self-pay | Admitting: Rheumatology

## 2015-12-24 NOTE — Telephone Encounter (Signed)
Patient is having a Simponi aria infusion on Monday and Laverne needs updated orders faxed to [phone]

## 2015-12-24 NOTE — Telephone Encounter (Signed)
Orders are in Epic 

## 2015-12-27 ENCOUNTER — Ambulatory Visit (HOSPITAL_COMMUNITY): Payer: Medicare Other

## 2016-01-10 DIAGNOSIS — E663 Overweight: Secondary | ICD-10-CM | POA: Diagnosis not present

## 2016-01-10 DIAGNOSIS — Z23 Encounter for immunization: Secondary | ICD-10-CM | POA: Diagnosis not present

## 2016-01-10 DIAGNOSIS — S80812A Abrasion, left lower leg, initial encounter: Secondary | ICD-10-CM | POA: Diagnosis not present

## 2016-01-10 DIAGNOSIS — T148XXA Other injury of unspecified body region, initial encounter: Secondary | ICD-10-CM | POA: Diagnosis not present

## 2016-01-10 DIAGNOSIS — Z6825 Body mass index (BMI) 25.0-25.9, adult: Secondary | ICD-10-CM | POA: Diagnosis not present

## 2016-01-17 ENCOUNTER — Ambulatory Visit (HOSPITAL_COMMUNITY)
Admission: RE | Admit: 2016-01-17 | Discharge: 2016-01-17 | Disposition: A | Discharge status: Home / Self Care | Payer: Medicare Other | Source: Ambulatory Visit | Attending: Rheumatology | Admitting: Rheumatology

## 2016-01-17 ENCOUNTER — Telehealth: Payer: Self-pay | Admitting: Radiology

## 2016-01-17 ENCOUNTER — Other Ambulatory Visit: Payer: Self-pay | Admitting: Radiology

## 2016-01-17 DIAGNOSIS — M069 Rheumatoid arthritis, unspecified: Secondary | ICD-10-CM | POA: Diagnosis not present

## 2016-01-17 DIAGNOSIS — Z79899 Other long term (current) drug therapy: Secondary | ICD-10-CM

## 2016-01-17 DIAGNOSIS — M0579 Rheumatoid arthritis with rheumatoid factor of multiple sites without organ or systems involvement: Secondary | ICD-10-CM

## 2016-01-17 LAB — COMPREHENSIVE METABOLIC PANEL
ALBUMIN: 3.4 g/dL — AB (ref 3.5–5.0)
ALK PHOS: 60 U/L (ref 38–126)
ALT: 12 U/L — ABNORMAL LOW (ref 14–54)
ANION GAP: 8 (ref 5–15)
AST: 23 U/L (ref 15–41)
BUN: 13 mg/dL (ref 6–20)
CALCIUM: 9.1 mg/dL (ref 8.9–10.3)
CO2: 28 mmol/L (ref 22–32)
Chloride: 104 mmol/L (ref 101–111)
Creatinine, Ser: 0.82 mg/dL (ref 0.44–1.00)
GFR calc non Af Amer: >60 mL/min (ref >60)
GLUCOSE: 73 mg/dL (ref 65–99)
POTASSIUM: 3.5 mmol/L (ref 3.5–5.1)
SODIUM: 140 mmol/L (ref 135–145)
Total Bilirubin: 0.4 mg/dL (ref 0.3–1.2)
Total Protein: 5.7 g/dL — ABNORMAL LOW (ref 6.5–8.1)

## 2016-01-17 LAB — CBC WITH DIFFERENTIAL/PLATELET
BASOS PCT: 1 %
Basophils Absolute: 0.1 10*3/uL (ref 0.0–0.1)
EOS ABS: 0.3 10*3/uL (ref 0.0–0.7)
EOS PCT: 5 %
HCT: 37.5 % (ref 36.0–46.0)
HEMOGLOBIN: 12.3 g/dL (ref 12.0–15.0)
LYMPHS ABS: 1.1 10*3/uL (ref 0.7–4.0)
Lymphocytes Relative: 24 %
MCH: 31.5 pg (ref 26.0–34.0)
MCHC: 32.8 g/dL (ref 30.0–36.0)
MCV: 95.9 fL (ref 78.0–100.0)
MONO ABS: 0.6 10*3/uL (ref 0.1–1.0)
MONOS PCT: 13 %
NEUTROS PCT: 57 %
Neutro Abs: 2.7 10*3/uL (ref 1.7–7.7)
PLATELETS: 199 10*3/uL (ref 150–400)
RBC: 3.91 MIL/uL (ref 3.87–5.11)
RDW: 14.2 % (ref 11.5–15.5)
WBC: 4.8 10*3/uL (ref 4.0–10.5)

## 2016-01-17 MED ORDER — ACETAMINOPHEN 325 MG PO TABS: 650.0000 mg | ORAL_TABLET | Freq: Once | ORAL | Status: DC

## 2016-01-17 MED ORDER — DIPHENHYDRAMINE HCL 25 MG PO CAPS: 25.0000 mg | ORAL_CAPSULE | Freq: Once | ORAL | Status: DC

## 2016-01-17 MED ORDER — SODIUM CHLORIDE 0.9 % IV SOLN
2.0000 mg/kg | INTRAVENOUS | Status: DC
  Administered 2016-01-17: 136.25 mg via INTRAVENOUS
  Filled 2016-01-17: qty 10.9

## 2016-01-17 NOTE — Telephone Encounter (Signed)
I have called patient to advise labs are normal  

## 2016-01-18 NOTE — Progress Notes (Signed)
Labs normal except low protein

## 2016-01-19 ENCOUNTER — Telehealth: Payer: Self-pay | Admitting: Rheumatology

## 2016-01-19 NOTE — Telephone Encounter (Signed)
Patient's son called and patient needs a refill of celebrex sent to [facility] please

## 2016-01-20 MED ORDER — CELECOXIB 100 MG PO CAPS: 100.0000 mg | ORAL_CAPSULE | Freq: Every day | ORAL | 2 refills | Status: DC | PRN

## 2016-01-20 NOTE — Telephone Encounter (Signed)
11/30/15 last visit Next visit 04/18/16 Labs WNL with infusion this week  Ok to refill per Dr Estanislado Pandy

## 2016-02-11 ENCOUNTER — Other Ambulatory Visit: Payer: Self-pay | Admitting: Rheumatology

## 2016-02-16 ENCOUNTER — Other Ambulatory Visit: Payer: Self-pay | Admitting: Rheumatology

## 2016-02-16 ENCOUNTER — Telehealth (INDEPENDENT_AMBULATORY_CARE_PROVIDER_SITE_OTHER): Payer: Self-pay | Admitting: Physical Medicine and Rehabilitation

## 2016-02-16 MED ORDER — METHOTREXATE 2.5 MG PO TABS: 10.0000 mg | ORAL_TABLET | ORAL | 0 refills | Status: DC

## 2016-02-16 NOTE — Telephone Encounter (Signed)
Last visit 11/30/15 Labs normal 01/17/16 Next visit 04/18/16 Ok to refill per Dr Estanislado Pandy

## 2016-02-16 NOTE — Telephone Encounter (Signed)
No, Mr Carlyon Shadow and Dr Estanislado Pandy are out of the office today. Sorry

## 2016-02-16 NOTE — Telephone Encounter (Signed)
Last Visit:11/30/15 Next Visit: 04/18/16 Labs: 01/17/16 WNL  Okay to refill MTX?

## 2016-02-16 NOTE — Addendum Note (Signed)
Addended byCandice Camp on: 02/16/2016 01:07 PM   Modules accepted: Orders

## 2016-02-16 NOTE — Telephone Encounter (Signed)
[facility] called about refill of MTX. Please call them back at [phone]. Reference # [id number]

## 2016-02-17 NOTE — Telephone Encounter (Signed)
Refilled by Dr. Erlinda Hong and faxed to [facility].

## 2016-02-28 ENCOUNTER — Telehealth (INDEPENDENT_AMBULATORY_CARE_PROVIDER_SITE_OTHER): Payer: Self-pay | Admitting: Orthopaedic Surgery

## 2016-02-29 ENCOUNTER — Telehealth: Payer: Self-pay

## 2016-02-29 NOTE — Telephone Encounter (Signed)
Spoke with Joann from [employer] (patients' secondary coverage). Verified that the patient's infusion outpatient service will not require a prior authorization.   Reference: [id number] Phone: [phone]   Demetrios Loll, CPhT

## 2016-03-01 NOTE — Telephone Encounter (Signed)
Did you prescribe this Rx for patient. If so what strength, directions and number of refills?

## 2016-03-01 NOTE — Telephone Encounter (Signed)
Is she my patient?

## 2016-03-01 NOTE — Telephone Encounter (Signed)
SD pt 

## 2016-03-02 NOTE — Telephone Encounter (Signed)
Called and verified as requested.

## 2016-03-02 NOTE — Telephone Encounter (Signed)
See message.

## 2016-03-02 NOTE — Telephone Encounter (Signed)
This prescription has not been prescribed by Dr. Estanislado Pandy. Looks as if it has been prescribed by Dr. Ernestina Patches.

## 2016-03-10 ENCOUNTER — Other Ambulatory Visit (HOSPITAL_COMMUNITY): Payer: Self-pay | Admitting: *Deleted

## 2016-03-13 ENCOUNTER — Ambulatory Visit (HOSPITAL_COMMUNITY)
Admission: RE | Admit: 2016-03-13 | Discharge: 2016-03-13 | Disposition: A | Discharge status: Home / Self Care | Payer: Medicare Other | Source: Ambulatory Visit | Attending: Rheumatology | Admitting: Rheumatology

## 2016-03-13 DIAGNOSIS — M069 Rheumatoid arthritis, unspecified: Secondary | ICD-10-CM | POA: Insufficient documentation

## 2016-03-13 MED ORDER — ACETAMINOPHEN 325 MG PO TABS: 650.0000 mg | ORAL_TABLET | ORAL | Status: DC

## 2016-03-13 MED ORDER — SODIUM CHLORIDE 0.9 % IV SOLN
2.0000 mg/kg | Freq: Once | INTRAVENOUS | Status: AC
  Administered 2016-03-13: 137.5 mg via INTRAVENOUS
  Filled 2016-03-13: qty 11

## 2016-03-13 MED ORDER — DIPHENHYDRAMINE HCL 25 MG PO CAPS: 25.0000 mg | ORAL_CAPSULE | Freq: Once | ORAL | Status: DC

## 2016-03-13 MED ORDER — SODIUM CHLORIDE 0.9 % IV SOLN
INTRAVENOUS | Status: DC
  Administered 2016-03-13: 10:00:00 via INTRAVENOUS

## 2016-03-31 ENCOUNTER — Other Ambulatory Visit: Payer: Self-pay | Admitting: *Deleted

## 2016-03-31 MED ORDER — CELECOXIB 100 MG PO CAPS: 100.0000 mg | ORAL_CAPSULE | Freq: Every day | ORAL | 2 refills | Status: DC | PRN

## 2016-03-31 NOTE — Telephone Encounter (Signed)
Last Visit: 11/30/15 Next Visit: 04/18/16 Labs: 01/17/16 Labs normal except low protein  Okay to refill Celebrex?

## 2016-04-10 DIAGNOSIS — M19071 Primary osteoarthritis, right ankle and foot: Secondary | ICD-10-CM | POA: Insufficient documentation

## 2016-04-10 DIAGNOSIS — M170 Bilateral primary osteoarthritis of knee: Secondary | ICD-10-CM | POA: Insufficient documentation

## 2016-04-10 DIAGNOSIS — M47816 Spondylosis without myelopathy or radiculopathy, lumbar region: Secondary | ICD-10-CM | POA: Insufficient documentation

## 2016-04-10 DIAGNOSIS — M19042 Primary osteoarthritis, left hand: Secondary | ICD-10-CM

## 2016-04-10 DIAGNOSIS — M810 Age-related osteoporosis without current pathological fracture: Secondary | ICD-10-CM | POA: Insufficient documentation

## 2016-04-10 DIAGNOSIS — M19072 Primary osteoarthritis, left ankle and foot: Secondary | ICD-10-CM

## 2016-04-10 DIAGNOSIS — M19041 Primary osteoarthritis, right hand: Secondary | ICD-10-CM | POA: Insufficient documentation

## 2016-04-10 DIAGNOSIS — Z8659 Personal history of other mental and behavioral disorders: Secondary | ICD-10-CM | POA: Insufficient documentation

## 2016-04-10 DIAGNOSIS — Z8639 Personal history of other endocrine, nutritional and metabolic disease: Secondary | ICD-10-CM | POA: Insufficient documentation

## 2016-04-10 DIAGNOSIS — Z862 Personal history of diseases of the blood and blood-forming organs and certain disorders involving the immune mechanism: Secondary | ICD-10-CM | POA: Insufficient documentation

## 2016-04-10 DIAGNOSIS — Z79899 Other long term (current) drug therapy: Secondary | ICD-10-CM | POA: Insufficient documentation

## 2016-04-10 DIAGNOSIS — Z8669 Personal history of other diseases of the nervous system and sense organs: Secondary | ICD-10-CM | POA: Insufficient documentation

## 2016-04-10 NOTE — Progress Notes (Signed)
Office Visit Note  Patient: Brittany Solomon             Date of Birth: Sep 24, 1925           MRN: [id number]             PCP: Cyndi Bender, PA-C Referring: Cyndi Bender, PA-C Visit Date: 04/18/2016 Occupation: '@GUAROCC'$ @    Subjective:  Left second finger pain   History of Present Illness: Brittany Solomon is a 83 y.o. female with history of sero positive rheumatoid arthritis. She states she's been doing fairly well except for some discomfort in her left second finger. None of the other joints are painful. Her right ankle replacement is doing well. She's not complaining of her lower back pain.  Activities of Daily Living:  Patient reports morning stiffness for 30 minutes.   Patient Denies nocturnal pain.  Difficulty dressing/grooming: Denies Difficulty climbing stairs: Reports Difficulty getting out of chair: Reports Difficulty using hands for taps, buttons, cutlery, and/or writing: Reports   Review of Systems  Constitutional: Positive for fatigue. Negative for night sweats, weight gain, weight loss and weakness.  HENT: Positive for mouth dryness. Negative for mouth sores, trouble swallowing, trouble swallowing and nose dryness.   Eyes: Negative for pain, redness, visual disturbance and dryness.  Respiratory: Negative for cough, shortness of breath and difficulty breathing.   Cardiovascular: Negative for chest pain, palpitations, hypertension, irregular heartbeat and swelling in legs/feet.  Gastrointestinal: Negative for blood in stool, constipation and diarrhea.  Endocrine: Negative for increased urination.  Genitourinary: Negative for vaginal dryness.  Musculoskeletal: Positive for arthralgias, joint pain and morning stiffness. Negative for joint swelling, myalgias, muscle weakness, muscle tenderness and myalgias.  Skin: Negative for color change, rash, hair loss, skin tightness, ulcers and sensitivity to sunlight.  Allergic/Immunologic: Negative for susceptible to infections.    Neurological: Negative for dizziness, memory loss and night sweats.  Hematological: Negative for swollen glands.  Psychiatric/Behavioral: Negative for depressed mood and sleep disturbance. The patient is not nervous/anxious.     PMFS History:  Patient Active Problem List   Diagnosis Date Noted  . History of total replacement of right ankle 04/17/2016  . Primary osteoarthritis of both hands 04/10/2016  . Primary osteoarthritis of both knees 04/10/2016  . Primary osteoarthritis of both feet 04/10/2016  . Spondylosis of lumbar region without myelopathy or radiculopathy 04/10/2016  . Age-related osteoporosis without current pathological fracture 04/10/2016  . High risk medication use 04/10/2016  . History of glaucoma 04/10/2016  . History of dementia 04/10/2016  . History of anemia 04/10/2016  . History of hyperlipidemia 04/10/2016  . Primary osteoarthritis of right ankle 04/10/2016  . Rheumatoid arthritis of multiple sites without organ or system involvement with positive rheumatoid factor ([place]) 10/25/2015    Past Medical History:  Diagnosis Date  . Anemia   . Benign neoplasm of colon   . Depression   . Diverticulitis   . Hearing impairment   . Hypercholesteremia   . Hypertension   . Memory loss   . Osteoporosis   . Rheumatoid arthritis ([place])   . Shingles   . Spinal stenosis     Family History  Problem Relation Age of Onset  . Parkinsonism Mother   . Heart attack Father    Past Surgical History:  Procedure Laterality Date  . ANKLE SURGERY    . BACK SURGERY    . GALLBLADDER SURGERY    . TONSILLECTOMY AND ADENOIDECTOMY    . VAGINAL HYSTERECTOMY  Social History   Social History Narrative   Patient lives at home alone.   Caffeine Use: none     Objective: Vital Signs: BP 135/62   Pulse 81   Resp 14   Ht '5\' 4"'$  (1.626 m)   Wt 150 lb (68 kg)   BMI 25.75 kg/m    Physical Exam  Constitutional: She is oriented to person, place, and time. She appears  well-developed and well-nourished.  HENT:  Head: Normocephalic and atraumatic.  Eyes: Conjunctivae and EOM are normal.  Neck: Normal range of motion.  Cardiovascular: Normal rate, regular rhythm, normal heart sounds and intact distal pulses.   Pulmonary/Chest: Effort normal and breath sounds normal.  Abdominal: Soft. Bowel sounds are normal.  Lymphadenopathy:    She has no cervical adenopathy.  Neurological: She is alert and oriented to person, place, and time.  Skin: Skin is warm and dry. Capillary refill takes less than 2 seconds.  Psychiatric: She has a normal mood and affect. Her behavior is normal.  Nursing note and vitals reviewed.    Musculoskeletal Exam: C-spine limited range of motion thoracic and lumbar spine limited range of motion. Shoulder joints elbow joints are good range of motion. She has MCP thickening with no synovitis. She has PIP/DIP thickening with no synovitis. Her left second PIP joint has limited range of motion. Hip joints knee joints are good range of motion in her right ankle joint is replaced with him limited range of motion. No synovitis was noted.  CDAI Exam: CDAI Homunculus Exam:   Joint Counts:  CDAI Tender Joint count: 0 CDAI Swollen Joint count: 0  Global Assessments:  Patient Global Assessment: 3 Provider Global Assessment: 3  CDAI Calculated Score: 6    Investigation: Findings:  May 2017:  CBC and comprehensive metabolic panel was normal.    07/28/2015 X-ray of bilateral hands, 2-views, today showed bilateral PIP and DIP narrowing, right second MCP narrowing in bilateral intracarpal joints there is severe narrowing, radiocarpal joints there is severe narrowing.  She has left intracarpal joint fusion and bilateral ulnar styloid erosion.  Bilateral feet x-ray showed bilateral first MTP, DIP, PIP narrowing, cystic changes in the metatarsals, tarsal erosion and left dorsal spur and right ankle replacement hardware was noted.  Right knee joint  x-ray, which has been painful, showed medial compartment narrowing, moderate patellofemoral narrowing, no chondrocalcinosis.  Her RAPID3 score was 5.3, which was high severity today.  03/2014 negative TB gold , January 2014 hepatitis negative  No visits with results within 3 Month(s) from this visit.  Latest known visit with results is:  Hospital Outpatient Visit on 01/17/2016  Component Date Value Ref Range Status  . WBC 01/17/2016 4.8  4.0 - 10.5 K/uL Final  . RBC 01/17/2016 3.91  3.87 - 5.11 MIL/uL Final  . Hemoglobin 01/17/2016 12.3  12.0 - 15.0 g/dL Final  . HCT 01/17/2016 37.5  36.0 - 46.0 % Final  . MCV 01/17/2016 95.9  78.0 - 100.0 fL Final  . MCH 01/17/2016 31.5  26.0 - 34.0 pg Final  . MCHC 01/17/2016 32.8  30.0 - 36.0 g/dL Final  . RDW 01/17/2016 14.2  11.5 - 15.5 % Final  . Platelets 01/17/2016 199  150 - 400 K/uL Final  . Neutrophils Relative % 01/17/2016 57  % Final  . Neutro Abs 01/17/2016 2.7  1.7 - 7.7 K/uL Final  . Lymphocytes Relative 01/17/2016 24  % Final  . Lymphs Abs 01/17/2016 1.1  0.7 - 4.0 K/uL Final  .  Monocytes Relative 01/17/2016 13  % Final  . Monocytes Absolute 01/17/2016 0.6  0.1 - 1.0 K/uL Final  . Eosinophils Relative 01/17/2016 5  % Final  . Eosinophils Absolute 01/17/2016 0.3  0.0 - 0.7 K/uL Final  . Basophils Relative 01/17/2016 1  % Final  . Basophils Absolute 01/17/2016 0.1  0.0 - 0.1 K/uL Final  . Sodium 01/17/2016 140  135 - 145 mmol/L Final  . Potassium 01/17/2016 3.5  3.5 - 5.1 mmol/L Final  . Chloride 01/17/2016 104  101 - 111 mmol/L Final  . CO2 01/17/2016 28  22 - 32 mmol/L Final  . Glucose, Bld 01/17/2016 73  65 - 99 mg/dL Final  . BUN 01/17/2016 13  6 - 20 mg/dL Final  . Creatinine, Ser 01/17/2016 0.82  0.44 - 1.00 mg/dL Final  . Calcium 01/17/2016 9.1  8.9 - 10.3 mg/dL Final  . Total Protein 01/17/2016 5.7* 6.5 - 8.1 g/dL Final  . Albumin 01/17/2016 3.4* 3.5 - 5.0 g/dL Final  . AST 01/17/2016 23  15 - 41 U/L Final  . ALT 01/17/2016  12* 14 - 54 U/L Final  . Alkaline Phosphatase 01/17/2016 60  38 - 126 U/L Final  . Total Bilirubin 01/17/2016 0.4  0.3 - 1.2 mg/dL Final  . GFR calc non Af Amer 01/17/2016 >60  >60 mL/min Final  . GFR calc Af Amer 01/17/2016 >60  >60 mL/min Final   Comment: (NOTE) The eGFR has been calculated using the CKD EPI equation. This calculation has not been validated in all clinical situations. eGFR's persistently <60 mL/min signify possible Chronic Kidney Disease.   . Anion gap 01/17/2016 8  5 - 15 Final     Imaging: No results found.  Speciality Comments: No specialty comments available.    Procedures:  No procedures performed Allergies: Amoxicillin   Assessment / Plan:     Visit Diagnoses: Rheumatoid arthritis of multiple sites without organ or system involvement with positive rheumatoid factor (HCC) - Positive RF, erosive disease. She has no synovitis on examination today. She does have some chronic changes due to  rheumatoid arthritis and osteoarthritis  High risk medication use - Simponi Aria infusion , methotrexate, folic acid - Plan: CBC with Differential/Platelet, COMPLETE METABOLIC PANEL WITH GFR, Quantiferon tb gold assay (blood). Her future labs will be done with her infusions.  Primary osteoarthritis of both hands: Joint protection and muscle strengthening discussed  Primary osteoarthritis of both knees: Chronic pain  Primary osteoarthritis of both feet: Proper fitting shoes discussed  History of total replacement of right ankle: Doing well  Spondylosis of lumbar region without myelopathy or radiculopathy - s/p lumbar fusion : She has chronic pain  Age-related osteoporosis without current pathological fracture  History of glaucoma  History of dementia  History of anemia  History of hyperlipidemia    Orders: Orders Placed This Encounter  Procedures  . CBC with Differential/Platelet  . COMPLETE METABOLIC PANEL WITH GFR  . Quantiferon tb gold assay (blood)     No orders of the defined types were placed in this encounter.   Face-to-face time spent with patient was 30 minutes. 50% of time was spent in counseling and coordination of care.  Follow-Up Instructions: Return in about 4 months (around 08/16/2016) for Rheumatoid arthritis.   Bo Merino, MD  Note - This record has been created using Editor, commission[employer].  Chart creation errors have been sought, but may not always  have been located. Such creation errors do not reflect on  the  standard of medical care. 

## 2016-04-17 DIAGNOSIS — Z96661 Presence of right artificial ankle joint: Secondary | ICD-10-CM | POA: Insufficient documentation

## 2016-04-18 ENCOUNTER — Encounter: Payer: Self-pay | Admitting: Rheumatology

## 2016-04-18 ENCOUNTER — Ambulatory Visit (INDEPENDENT_AMBULATORY_CARE_PROVIDER_SITE_OTHER): Payer: Medicare Other | Admitting: Rheumatology

## 2016-04-18 VITALS — BP 135/62 | HR 81 | Resp 14 | Ht 64.0 in | Wt 150.0 lb

## 2016-04-18 DIAGNOSIS — Z8659 Personal history of other mental and behavioral disorders: Secondary | ICD-10-CM | POA: Diagnosis not present

## 2016-04-18 DIAGNOSIS — M19041 Primary osteoarthritis, right hand: Secondary | ICD-10-CM | POA: Diagnosis not present

## 2016-04-18 DIAGNOSIS — M810 Age-related osteoporosis without current pathological fracture: Secondary | ICD-10-CM | POA: Diagnosis not present

## 2016-04-18 DIAGNOSIS — Z862 Personal history of diseases of the blood and blood-forming organs and certain disorders involving the immune mechanism: Secondary | ICD-10-CM | POA: Diagnosis not present

## 2016-04-18 DIAGNOSIS — Z8639 Personal history of other endocrine, nutritional and metabolic disease: Secondary | ICD-10-CM | POA: Diagnosis not present

## 2016-04-18 DIAGNOSIS — Z96661 Presence of right artificial ankle joint: Secondary | ICD-10-CM | POA: Diagnosis not present

## 2016-04-18 DIAGNOSIS — M19071 Primary osteoarthritis, right ankle and foot: Secondary | ICD-10-CM | POA: Diagnosis not present

## 2016-04-18 DIAGNOSIS — M47816 Spondylosis without myelopathy or radiculopathy, lumbar region: Secondary | ICD-10-CM | POA: Diagnosis not present

## 2016-04-18 DIAGNOSIS — Z79899 Other long term (current) drug therapy: Secondary | ICD-10-CM

## 2016-04-18 DIAGNOSIS — M19042 Primary osteoarthritis, left hand: Secondary | ICD-10-CM

## 2016-04-18 DIAGNOSIS — M19072 Primary osteoarthritis, left ankle and foot: Secondary | ICD-10-CM

## 2016-04-18 DIAGNOSIS — Z8669 Personal history of other diseases of the nervous system and sense organs: Secondary | ICD-10-CM

## 2016-04-18 DIAGNOSIS — M0579 Rheumatoid arthritis with rheumatoid factor of multiple sites without organ or systems involvement: Secondary | ICD-10-CM

## 2016-04-18 DIAGNOSIS — M170 Bilateral primary osteoarthritis of knee: Secondary | ICD-10-CM | POA: Diagnosis not present

## 2016-04-18 LAB — COMPLETE METABOLIC PANEL WITH GFR
ALBUMIN: 4.2 g/dL (ref 3.6–5.1)
ALK PHOS: 76 U/L (ref 33–130)
ALT: 11 U/L (ref 6–29)
AST: 23 U/L (ref 10–35)
BILIRUBIN TOTAL: 0.9 mg/dL (ref 0.2–1.2)
BUN: 15 mg/dL (ref 7–25)
CO2: 29 mmol/L (ref 20–31)
CREATININE: 0.79 mg/dL (ref 0.60–0.88)
Calcium: 9.5 mg/dL (ref 8.6–10.4)
Chloride: 101 mmol/L (ref 98–110)
GFR, EST AFRICAN AMERICAN: 77 mL/min (ref >=60)
GFR, EST NON AFRICAN AMERICAN: 67 mL/min (ref >=60)
Glucose, Bld: 94 mg/dL (ref 65–99)
Potassium: 3.9 mmol/L (ref 3.5–5.3)
Sodium: 139 mmol/L (ref 135–146)
TOTAL PROTEIN: 6.5 g/dL (ref 6.1–8.1)

## 2016-04-18 LAB — CBC WITH DIFFERENTIAL/PLATELET
BASOS ABS: 69 {cells}/uL (ref 0–200)
Basophils Relative: 1 %
EOS ABS: 69 {cells}/uL (ref 15–500)
Eosinophils Relative: 1 %
HEMATOCRIT: 45.1 % — AB (ref 35.0–45.0)
HEMOGLOBIN: 14.9 g/dL (ref 11.7–15.5)
LYMPHS ABS: 1380 {cells}/uL (ref 850–3900)
Lymphocytes Relative: 20 %
MCH: 31.7 pg (ref 27.0–33.0)
MCHC: 33 g/dL (ref 32.0–36.0)
MCV: 96 fL (ref 80.0–100.0)
MONO ABS: 759 {cells}/uL (ref 200–950)
MPV: 11.2 fL (ref 7.5–12.5)
Monocytes Relative: 11 %
Neutro Abs: 4623 cells/uL (ref 1500–7800)
Neutrophils Relative %: 67 %
Platelets: 188 10*3/uL (ref 140–400)
RBC: 4.7 MIL/uL (ref 3.80–5.10)
RDW: 14 % (ref 11.0–15.0)
WBC: 6.9 10*3/uL (ref 3.8–10.8)

## 2016-04-18 NOTE — Progress Notes (Signed)
Rheumatology Medication Review by a Pharmacist Does the patient feel that his/her medications are working for him/her?  Yes Has the patient been experiencing any side effects to the medications prescribed?  No Does the patient have any problems obtaining medications?  No  Issues to address at subsequent visits: None   Pharmacist comments:  Brittany Solomon is a pleasant 83 yo F who presents for follow up of rheumatoid arthritis.  She is currently taking Simponi Aria 2 mg/kg every 8 weeks, methotrexate 4 tablets (10 mg) weekly, and folic acid 1 mg daily.  She gets her standing labs with her infusions; however, I do not see that standing labs were drawn with her January infusion.  Patient is due for standing labs today.  Patient's most recent TB Gold was negative on 03/22/15. She is due for TB Gold today.  Medications were reviewed with patient.  Patient reports she is taking celecoxib three times weekly.  I reviewed increased risk of adverse effects with celecoxib and methotrexate.  I advised patient to try to avoid celecoxib while on methotrexate.  Patient and her son voiced understanding.  Patient denies any questions regarding her medications at this time.    Elisabeth Most, Pharm.D., BCPS, CPP Clinical Pharmacist Pager: [phone] Phone: [phone] 04/18/2016 9:57 AM

## 2016-04-20 LAB — QUANTIFERON TB GOLD ASSAY (BLOOD)
Interferon Gamma Release Assay: NEGATIVE
Quantiferon Nil Value: 0.09 IU/mL
Quantiferon Tb Ag Minus Nil Value: <0 IU/mL

## 2016-04-20 NOTE — Progress Notes (Signed)
Labs normal.

## 2016-04-26 DIAGNOSIS — D509 Iron deficiency anemia, unspecified: Secondary | ICD-10-CM | POA: Diagnosis not present

## 2016-04-26 DIAGNOSIS — F329 Major depressive disorder, single episode, unspecified: Secondary | ICD-10-CM | POA: Diagnosis not present

## 2016-04-26 DIAGNOSIS — I10 Essential (primary) hypertension: Secondary | ICD-10-CM | POA: Diagnosis not present

## 2016-04-26 DIAGNOSIS — R413 Other amnesia: Secondary | ICD-10-CM | POA: Diagnosis not present

## 2016-04-26 DIAGNOSIS — E7800 Pure hypercholesterolemia, unspecified: Secondary | ICD-10-CM | POA: Diagnosis not present

## 2016-04-26 DIAGNOSIS — K219 Gastro-esophageal reflux disease without esophagitis: Secondary | ICD-10-CM | POA: Diagnosis not present

## 2016-04-26 DIAGNOSIS — M069 Rheumatoid arthritis, unspecified: Secondary | ICD-10-CM | POA: Diagnosis not present

## 2016-04-26 DIAGNOSIS — Z79899 Other long term (current) drug therapy: Secondary | ICD-10-CM | POA: Diagnosis not present

## 2016-05-03 ENCOUNTER — Other Ambulatory Visit (INDEPENDENT_AMBULATORY_CARE_PROVIDER_SITE_OTHER): Payer: Self-pay | Admitting: Radiology

## 2016-05-03 DIAGNOSIS — M0579 Rheumatoid arthritis with rheumatoid factor of multiple sites without organ or systems involvement: Secondary | ICD-10-CM

## 2016-05-03 NOTE — Progress Notes (Signed)
Last visit 04/18/16  Next visit 08/16/16 TB gold neg 03/2017 put in lab orders since they are not ready for release

## 2016-05-05 ENCOUNTER — Other Ambulatory Visit (HOSPITAL_COMMUNITY): Payer: Self-pay | Admitting: *Deleted

## 2016-05-08 ENCOUNTER — Ambulatory Visit (HOSPITAL_COMMUNITY)
Admission: RE | Admit: 2016-05-08 | Discharge: 2016-05-08 | Disposition: A | Discharge status: Home / Self Care | Payer: Medicare Other | Source: Ambulatory Visit | Attending: Rheumatology | Admitting: Rheumatology

## 2016-05-08 DIAGNOSIS — M069 Rheumatoid arthritis, unspecified: Secondary | ICD-10-CM | POA: Diagnosis not present

## 2016-05-08 LAB — CBC WITH DIFFERENTIAL/PLATELET
BASOS ABS: 0.1 10*3/uL (ref 0.0–0.1)
Basophils Relative: 1 %
Eosinophils Absolute: 0.2 10*3/uL (ref 0.0–0.7)
Eosinophils Relative: 5 %
HEMATOCRIT: 40.1 % (ref 36.0–46.0)
Hemoglobin: 13.5 g/dL (ref 12.0–15.0)
LYMPHS ABS: 1.3 10*3/uL (ref 0.7–4.0)
LYMPHS PCT: 31 %
MCH: 31.9 pg (ref 26.0–34.0)
MCHC: 33.7 g/dL (ref 30.0–36.0)
MCV: 94.8 fL (ref 78.0–100.0)
MONO ABS: 0.4 10*3/uL (ref 0.1–1.0)
MONOS PCT: 9 %
NEUTROS ABS: 2.3 10*3/uL (ref 1.7–7.7)
Neutrophils Relative %: 54 %
Platelets: 153 10*3/uL (ref 150–400)
RBC: 4.23 MIL/uL (ref 3.87–5.11)
RDW: 14.1 % (ref 11.5–15.5)
WBC: 4.3 10*3/uL (ref 4.0–10.5)

## 2016-05-08 LAB — COMPREHENSIVE METABOLIC PANEL
ALT: 12 U/L — ABNORMAL LOW (ref 14–54)
AST: 26 U/L (ref 15–41)
Albumin: 3.8 g/dL (ref 3.5–5.0)
Alkaline Phosphatase: 75 U/L (ref 38–126)
Anion gap: 6 (ref 5–15)
BILIRUBIN TOTAL: 0.7 mg/dL (ref 0.3–1.2)
BUN: 10 mg/dL (ref 6–20)
CO2: 31 mmol/L (ref 22–32)
CREATININE: 0.8 mg/dL (ref 0.44–1.00)
Calcium: 9.2 mg/dL (ref 8.9–10.3)
Chloride: 102 mmol/L (ref 101–111)
Glucose, Bld: 89 mg/dL (ref 65–99)
POTASSIUM: 3.9 mmol/L (ref 3.5–5.1)
Sodium: 139 mmol/L (ref 135–145)
TOTAL PROTEIN: 6.1 g/dL — AB (ref 6.5–8.1)

## 2016-05-08 MED ORDER — DIPHENHYDRAMINE HCL 25 MG PO TABS
25.0000 mg | ORAL_TABLET | Freq: Once | ORAL | Status: DC
  Filled 2016-05-08: qty 1

## 2016-05-08 MED ORDER — SODIUM CHLORIDE 0.9 % IV SOLN
INTRAVENOUS | Status: DC
  Administered 2016-05-08: 10:00:00 via INTRAVENOUS

## 2016-05-08 MED ORDER — SODIUM CHLORIDE 0.9 % IV SOLN
2.0000 mg/kg | Freq: Once | INTRAVENOUS | Status: AC
  Administered 2016-05-08: 137.5 mg via INTRAVENOUS
  Filled 2016-05-08: qty 11

## 2016-05-08 MED ORDER — ACETAMINOPHEN 325 MG PO TABS: 650.0000 mg | ORAL_TABLET | ORAL | Status: DC

## 2016-05-08 NOTE — Progress Notes (Signed)
WNL

## 2016-06-15 ENCOUNTER — Other Ambulatory Visit: Payer: Self-pay | Admitting: Diagnostic Neuroimaging

## 2016-06-20 DIAGNOSIS — F0390 Unspecified dementia without behavioral disturbance: Secondary | ICD-10-CM | POA: Diagnosis not present

## 2016-06-20 DIAGNOSIS — Z6824 Body mass index (BMI) 24.0-24.9, adult: Secondary | ICD-10-CM | POA: Diagnosis not present

## 2016-06-20 DIAGNOSIS — F329 Major depressive disorder, single episode, unspecified: Secondary | ICD-10-CM | POA: Diagnosis not present

## 2016-06-22 ENCOUNTER — Other Ambulatory Visit: Payer: Self-pay | Admitting: Radiology

## 2016-06-22 DIAGNOSIS — M0579 Rheumatoid arthritis with rheumatoid factor of multiple sites without organ or systems involvement: Secondary | ICD-10-CM

## 2016-06-22 NOTE — Progress Notes (Signed)
04/18/16 last visit  08/16/16 next visit  TB gold neg 04/20/16 Updated orders for infusions

## 2016-07-03 ENCOUNTER — Ambulatory Visit (HOSPITAL_COMMUNITY)
Admission: RE | Admit: 2016-07-03 | Discharge: 2016-07-03 | Disposition: A | Discharge status: Home / Self Care | Payer: Medicare Other | Source: Ambulatory Visit | Attending: Rheumatology | Admitting: Rheumatology

## 2016-07-03 DIAGNOSIS — M0579 Rheumatoid arthritis with rheumatoid factor of multiple sites without organ or systems involvement: Secondary | ICD-10-CM | POA: Diagnosis not present

## 2016-07-03 LAB — CBC WITH DIFFERENTIAL/PLATELET
BASOS PCT: 1 %
Basophils Absolute: 0.1 10*3/uL (ref 0.0–0.1)
EOS ABS: 0.1 10*3/uL (ref 0.0–0.7)
EOS PCT: 3 %
HCT: 42.4 % (ref 36.0–46.0)
Hemoglobin: 14.2 g/dL (ref 12.0–15.0)
LYMPHS ABS: 1.6 10*3/uL (ref 0.7–4.0)
Lymphocytes Relative: 32 %
MCH: 32.1 pg (ref 26.0–34.0)
MCHC: 33.5 g/dL (ref 30.0–36.0)
MCV: 95.9 fL (ref 78.0–100.0)
MONOS PCT: 11 %
Monocytes Absolute: 0.6 10*3/uL (ref 0.1–1.0)
NEUTROS PCT: 53 %
Neutro Abs: 2.7 10*3/uL (ref 1.7–7.7)
PLATELETS: 162 10*3/uL (ref 150–400)
RBC: 4.42 MIL/uL (ref 3.87–5.11)
RDW: 13.8 % (ref 11.5–15.5)
WBC: 5 10*3/uL (ref 4.0–10.5)

## 2016-07-03 LAB — COMPREHENSIVE METABOLIC PANEL
ALBUMIN: 3.8 g/dL (ref 3.5–5.0)
ALK PHOS: 70 U/L (ref 38–126)
ALT: 12 U/L — ABNORMAL LOW (ref 14–54)
AST: 27 U/L (ref 15–41)
Anion gap: 9 (ref 5–15)
BUN: 15 mg/dL (ref 6–20)
CO2: 28 mmol/L (ref 22–32)
Calcium: 9.2 mg/dL (ref 8.9–10.3)
Chloride: 103 mmol/L (ref 101–111)
Creatinine, Ser: 0.76 mg/dL (ref 0.44–1.00)
GFR calc Af Amer: >60 mL/min (ref >60)
GFR calc non Af Amer: >60 mL/min (ref >60)
GLUCOSE: 102 mg/dL — AB (ref 65–99)
POTASSIUM: 4 mmol/L (ref 3.5–5.1)
SODIUM: 140 mmol/L (ref 135–145)
Total Bilirubin: 0.9 mg/dL (ref 0.3–1.2)
Total Protein: 6.3 g/dL — ABNORMAL LOW (ref 6.5–8.1)

## 2016-07-03 MED ORDER — DIPHENHYDRAMINE HCL 25 MG PO CAPS: 25.0000 mg | ORAL_CAPSULE | ORAL | Status: DC

## 2016-07-03 MED ORDER — ACETAMINOPHEN 325 MG PO TABS: 650.0000 mg | ORAL_TABLET | ORAL | Status: DC

## 2016-07-03 MED ORDER — SODIUM CHLORIDE 0.9 % IV SOLN
2.0000 mg/kg | INTRAVENOUS | Status: DC
  Administered 2016-07-03: 136.25 mg via INTRAVENOUS
  Filled 2016-07-03: qty 10.9

## 2016-07-03 NOTE — Progress Notes (Signed)
stable °

## 2016-07-29 ENCOUNTER — Other Ambulatory Visit: Payer: Self-pay | Admitting: Rheumatology

## 2016-07-31 NOTE — Telephone Encounter (Signed)
Last Visit: 04/18/16 Next Visit: 08/16/16 Labs: 07/03/16 Stable  Okay to refill MTX?

## 2016-08-15 ENCOUNTER — Other Ambulatory Visit: Payer: Self-pay | Admitting: Radiology

## 2016-08-15 NOTE — Progress Notes (Signed)
Infusion orders are current for patient CBC CMP Tylenol Benadryl TB gold not due yet, appointments are up to date and follow up appointment  is scheduled 

## 2016-08-16 ENCOUNTER — Ambulatory Visit: Payer: Medicare Other | Admitting: Rheumatology

## 2016-08-16 ENCOUNTER — Ambulatory Visit (INDEPENDENT_AMBULATORY_CARE_PROVIDER_SITE_OTHER): Payer: Medicare Other | Admitting: Rheumatology

## 2016-08-16 ENCOUNTER — Encounter: Payer: Self-pay | Admitting: Rheumatology

## 2016-08-16 VITALS — BP 128/74 | HR 74 | Resp 14 | Ht 68.0 in | Wt 148.0 lb

## 2016-08-16 DIAGNOSIS — Z79899 Other long term (current) drug therapy: Secondary | ICD-10-CM | POA: Diagnosis not present

## 2016-08-16 DIAGNOSIS — M19041 Primary osteoarthritis, right hand: Secondary | ICD-10-CM

## 2016-08-16 DIAGNOSIS — M19042 Primary osteoarthritis, left hand: Secondary | ICD-10-CM | POA: Diagnosis not present

## 2016-08-16 DIAGNOSIS — M0579 Rheumatoid arthritis with rheumatoid factor of multiple sites without organ or systems involvement: Secondary | ICD-10-CM

## 2016-08-16 DIAGNOSIS — M170 Bilateral primary osteoarthritis of knee: Secondary | ICD-10-CM

## 2016-08-16 NOTE — Progress Notes (Signed)
Office Visit Note  Patient: Brittany Solomon             Date of Birth: 1925-11-01           MRN: [id number]             PCP: Cyndi Bender, PA-C Referring: Cyndi Bender, PA-C Visit Date: 08/16/2016 Occupation: _0 @    Subjective:  Medication Management   History of Present Illness: Brittany Solomon is a 83 y.o. female  Last seen 04/18/2016 for RA and taking SIMPONI ARIA 74m/kg every 8 weeks, mtx 4/wk, folic acid 128mdaily TB GOLD IS negative Apr 18, 2016  Patient states that the neck Simponi ArDonalda Ewingss scheduled for around July 2018. MTX 4/wk Folic acid 30m64md   Doing well except left 2nd finger won't flex.(history of same; old damage to pip joint) and left dip with angulation (old damage; history of same).    Activities of Daily Living:  Patient reports morning stiffness for 15 minutes.   Patient Denies nocturnal pain.  Difficulty dressing/grooming: Denies Difficulty climbing stairs: Denies Difficulty getting out of chair: Denies Difficulty using hands for taps, buttons, cutlery, and/or writing: Denies   No Rheumatology ROS completed.   PMFS History:  Patient Active Problem List   Diagnosis Date Noted  . History of total replacement of right ankle 04/17/2016  . Primary osteoarthritis of both hands 04/10/2016  . Primary osteoarthritis of both knees 04/10/2016  . Primary osteoarthritis of both feet 04/10/2016  . Spondylosis of lumbar region without myelopathy or radiculopathy 04/10/2016  . Age-related osteoporosis without current pathological fracture 04/10/2016  . High risk medication use 04/10/2016  . History of glaucoma 04/10/2016  . History of dementia 04/10/2016  . History of anemia 04/10/2016  . History of hyperlipidemia 04/10/2016  . Primary osteoarthritis of right ankle 04/10/2016  . Rheumatoid arthritis of multiple sites without organ or system involvement with positive rheumatoid factor ([place]/05/2015    Past Medical History:  Diagnosis Date  . Anemia    . Benign neoplasm of colon   . Depression   . Diverticulitis   . Hearing impairment   . Hypercholesteremia   . Hypertension   . Memory loss   . Osteoporosis   . Rheumatoid arthritis ([place] . Shingles   . Spinal stenosis     Family History  Problem Relation Age of Onset  . Parkinsonism Mother   . Heart attack Father    Past Surgical History:  Procedure Laterality Date  . ANKLE SURGERY    . BACK SURGERY    . GALLBLADDER SURGERY    . TONSILLECTOMY AND ADENOIDECTOMY    . VAGINAL HYSTERECTOMY     Social History   Social History Narrative   Patient lives at home alone.   Caffeine Use: none     Objective: Vital Signs: BP 128/74   Pulse 74   Resp 14   Ht _1  (1.727 m)   Wt 148 lb (67.1 kg)   BMI 22.50 kg/m    Physical Exam   Musculoskeletal Exam:  Full range of motion of all joints except decreased range of motion of left second and fifth digit secondary to old damage to the left second PIP joint and left fifth DIP joint. Grip strength is equal and strong bilaterally except slightly poor grip formation in the left secondary to those damage fingers Fiber myalgia tender points are all absent  CDAI Exam: No CDAI exam completed.  No synovitis  Investigation:  No additional findings. Hospital Outpatient Visit on 07/03/2016  Component Date Value Ref Range Status  . WBC 07/03/2016 5.0  4.0 - 10.5 K/uL Final  . RBC 07/03/2016 4.42  3.87 - 5.11 MIL/uL Final  . Hemoglobin 07/03/2016 14.2  12.0 - 15.0 g/dL Final  . HCT 07/03/2016 42.4  36.0 - 46.0 % Final  . MCV 07/03/2016 95.9  78.0 - 100.0 fL Final  . MCH 07/03/2016 32.1  26.0 - 34.0 pg Final  . MCHC 07/03/2016 33.5  30.0 - 36.0 g/dL Final  . RDW 07/03/2016 13.8  11.5 - 15.5 % Final  . Platelets 07/03/2016 162  150 - 400 K/uL Final  . Neutrophils Relative % 07/03/2016 53  % Final  . Neutro Abs 07/03/2016 2.7  1.7 - 7.7 K/uL Final  . Lymphocytes Relative 07/03/2016 32  % Final  . Lymphs Abs 07/03/2016 1.6  0.7  - 4.0 K/uL Final  . Monocytes Relative 07/03/2016 11  % Final  . Monocytes Absolute 07/03/2016 0.6  0.1 - 1.0 K/uL Final  . Eosinophils Relative 07/03/2016 3  % Final  . Eosinophils Absolute 07/03/2016 0.1  0.0 - 0.7 K/uL Final  . Basophils Relative 07/03/2016 1  % Final  . Basophils Absolute 07/03/2016 0.1  0.0 - 0.1 K/uL Final  . Sodium 07/03/2016 140  135 - 145 mmol/L Final  . Potassium 07/03/2016 4.0  3.5 - 5.1 mmol/L Final  . Chloride 07/03/2016 103  101 - 111 mmol/L Final  . CO2 07/03/2016 28  22 - 32 mmol/L Final  . Glucose, Bld 07/03/2016 102* 65 - 99 mg/dL Final  . BUN 07/03/2016 15  6 - 20 mg/dL Final  . Creatinine, Ser 07/03/2016 0.76  0.44 - 1.00 mg/dL Final  . Calcium 07/03/2016 9.2  8.9 - 10.3 mg/dL Final  . Total Protein 07/03/2016 6.3* 6.5 - 8.1 g/dL Final  . Albumin 07/03/2016 3.8  3.5 - 5.0 g/dL Final  . AST 07/03/2016 27  15 - 41 U/L Final  . ALT 07/03/2016 12* 14 - 54 U/L Final  . Alkaline Phosphatase 07/03/2016 70  38 - 126 U/L Final  . Total Bilirubin 07/03/2016 0.9  0.3 - 1.2 mg/dL Final  . GFR calc non Af Amer 07/03/2016 >60  >60 mL/min Final  . GFR calc Af Amer 07/03/2016 >60  >60 mL/min Final   Comment: (NOTE) The eGFR has been calculated using the CKD EPI equation. This calculation has not been validated in all clinical situations. eGFR's persistently <60 mL/min signify possible Chronic Kidney Disease.   Georgiann Hahn gap 07/03/2016 9  5 - 15 Final  Hospital Outpatient Visit on 05/08/2016  Component Date Value Ref Range Status  . WBC 05/08/2016 4.3  4.0 - 10.5 K/uL Final  . RBC 05/08/2016 4.23  3.87 - 5.11 MIL/uL Final  . Hemoglobin 05/08/2016 13.5  12.0 - 15.0 g/dL Final  . HCT 05/08/2016 40.1  36.0 - 46.0 % Final  . MCV 05/08/2016 94.8  78.0 - 100.0 fL Final  . MCH 05/08/2016 31.9  26.0 - 34.0 pg Final  . MCHC 05/08/2016 33.7  30.0 - 36.0 g/dL Final  . RDW 05/08/2016 14.1  11.5 - 15.5 % Final  . Platelets 05/08/2016 153  150 - 400 K/uL Final  .  Neutrophils Relative % 05/08/2016 54  % Final  . Neutro Abs 05/08/2016 2.3  1.7 - 7.7 K/uL Final  . Lymphocytes Relative 05/08/2016 31  % Final  . Lymphs Abs 05/08/2016 1.3  0.7 -  4.0 K/uL Final  . Monocytes Relative 05/08/2016 9  % Final  . Monocytes Absolute 05/08/2016 0.4  0.1 - 1.0 K/uL Final  . Eosinophils Relative 05/08/2016 5  % Final  . Eosinophils Absolute 05/08/2016 0.2  0.0 - 0.7 K/uL Final  . Basophils Relative 05/08/2016 1  % Final  . Basophils Absolute 05/08/2016 0.1  0.0 - 0.1 K/uL Final  . Sodium 05/08/2016 139  135 - 145 mmol/L Final  . Potassium 05/08/2016 3.9  3.5 - 5.1 mmol/L Final  . Chloride 05/08/2016 102  101 - 111 mmol/L Final  . CO2 05/08/2016 31  22 - 32 mmol/L Final  . Glucose, Bld 05/08/2016 89  65 - 99 mg/dL Final  . BUN 05/08/2016 10  6 - 20 mg/dL Final  . Creatinine, Ser 05/08/2016 0.80  0.44 - 1.00 mg/dL Final  . Calcium 05/08/2016 9.2  8.9 - 10.3 mg/dL Final  . Total Protein 05/08/2016 6.1* 6.5 - 8.1 g/dL Final  . Albumin 05/08/2016 3.8  3.5 - 5.0 g/dL Final  . AST 05/08/2016 26  15 - 41 U/L Final  . ALT 05/08/2016 12* 14 - 54 U/L Final  . Alkaline Phosphatase 05/08/2016 75  38 - 126 U/L Final  . Total Bilirubin 05/08/2016 0.7  0.3 - 1.2 mg/dL Final  . GFR calc non Af Amer 05/08/2016 >60  >60 mL/min Final  . GFR calc Af Amer 05/08/2016 >60  >60 mL/min Final   Comment: (NOTE) The eGFR has been calculated using the CKD EPI equation. This calculation has not been validated in all clinical situations. eGFR's persistently <60 mL/min signify possible Chronic Kidney Disease.   . Anion gap 05/08/2016 6  5 - 15 Final     Imaging: No results found.  Speciality Comments: No specialty comments available.    Procedures:  No procedures performed Allergies: Amoxicillin   Assessment / Plan:     Visit Diagnoses: Rheumatoid arthritis of multiple sites without organ or system involvement with positive rheumatoid factor (HCC)  High risk medication  use  Primary osteoarthritis of both hands  Primary osteoarthritis of both knees   Plan: #1: Rheumatoid arthritis. Patient is doing well. No joint pain, swelling, stiffness. History of old damage to the left second PIP joint and left fifth DIP joint.  #2: High risk prescription. Simphoni Aria 2 mg/kg every 8 weeks. Next infusion will be due in July 2018  #3: History of osteoarthritis of bilateral hands. DIP PIP prominence bilaterally  #4: History of OA of bilateral feet. Ongoing.  #5: Labs are up-to-date and within normal limits TB go was negative February 2018  #6: Neck since fusion due in July 2018 (already scheduled).  #7: Patient is accompanied by her son. He gives a history on behalf of the patient. When appropriate, the patient is able to share some of her history.  #8: Return to clinic in 5 months   Orders: No orders of the defined types were placed in this encounter.  No orders of the defined types were placed in this encounter.   Face-to-face time spent with patient was 30 minutes. 50% of time was spent in counseling and coordination of care.  Follow-Up Instructions: No Follow-up on file.   Eliezer Lofts, PA-C  Note - This record has been created using [employer].  Chart creation errors have been sought, but may not always  have been located. Such creation errors do not reflect on  the standard of medical care.

## 2016-08-28 ENCOUNTER — Ambulatory Visit (HOSPITAL_COMMUNITY)
Admission: RE | Admit: 2016-08-28 | Discharge: 2016-08-28 | Disposition: A | Discharge status: Home / Self Care | Payer: Medicare Other | Source: Ambulatory Visit | Attending: Rheumatology | Admitting: Rheumatology

## 2016-08-28 DIAGNOSIS — M069 Rheumatoid arthritis, unspecified: Secondary | ICD-10-CM | POA: Diagnosis not present

## 2016-08-28 DIAGNOSIS — M0579 Rheumatoid arthritis with rheumatoid factor of multiple sites without organ or systems involvement: Secondary | ICD-10-CM

## 2016-08-28 LAB — COMPREHENSIVE METABOLIC PANEL
ALT: 12 U/L — AB (ref 14–54)
AST: 26 U/L (ref 15–41)
Albumin: 3.9 g/dL (ref 3.5–5.0)
Alkaline Phosphatase: 71 U/L (ref 38–126)
Anion gap: 8 (ref 5–15)
BUN: 12 mg/dL (ref 6–20)
CHLORIDE: 100 mmol/L — AB (ref 101–111)
CO2: 29 mmol/L (ref 22–32)
CREATININE: 0.78 mg/dL (ref 0.44–1.00)
Calcium: 9.3 mg/dL (ref 8.9–10.3)
GFR calc non Af Amer: >60 mL/min (ref >60)
Glucose, Bld: 84 mg/dL (ref 65–99)
POTASSIUM: 3.7 mmol/L (ref 3.5–5.1)
SODIUM: 137 mmol/L (ref 135–145)
Total Bilirubin: 0.7 mg/dL (ref 0.3–1.2)
Total Protein: 6.5 g/dL (ref 6.5–8.1)

## 2016-08-28 LAB — CBC WITH DIFFERENTIAL/PLATELET
BASOS ABS: 0.1 10*3/uL (ref 0.0–0.1)
Basophils Relative: 1 %
EOS ABS: 0.1 10*3/uL (ref 0.0–0.7)
EOS PCT: 3 %
HCT: 44.6 % (ref 36.0–46.0)
Hemoglobin: 14.5 g/dL (ref 12.0–15.0)
LYMPHS ABS: 1.4 10*3/uL (ref 0.7–4.0)
Lymphocytes Relative: 30 %
MCH: 31.2 pg (ref 26.0–34.0)
MCHC: 32.5 g/dL (ref 30.0–36.0)
MCV: 95.9 fL (ref 78.0–100.0)
Monocytes Absolute: 0.4 10*3/uL (ref 0.1–1.0)
Monocytes Relative: 8 %
Neutro Abs: 2.7 10*3/uL (ref 1.7–7.7)
Neutrophils Relative %: 58 %
PLATELETS: 169 10*3/uL (ref 150–400)
RBC: 4.65 MIL/uL (ref 3.87–5.11)
RDW: 13.5 % (ref 11.5–15.5)
WBC: 4.6 10*3/uL (ref 4.0–10.5)

## 2016-08-28 MED ORDER — DIPHENHYDRAMINE HCL 25 MG PO CAPS: 25.0000 mg | ORAL_CAPSULE | ORAL | Status: DC

## 2016-08-28 MED ORDER — ACETAMINOPHEN 325 MG PO TABS: 650.0000 mg | ORAL_TABLET | ORAL | Status: DC

## 2016-08-28 MED ORDER — SODIUM CHLORIDE 0.9 % IV SOLN
2.0000 mg/kg | INTRAVENOUS | Status: DC
  Administered 2016-08-28: 137.5 mg via INTRAVENOUS
  Filled 2016-08-28: qty 11

## 2016-08-28 NOTE — Progress Notes (Signed)
Within normal limits

## 2016-09-04 DIAGNOSIS — H35363 Drusen (degenerative) of macula, bilateral: Secondary | ICD-10-CM | POA: Diagnosis not present

## 2016-09-04 DIAGNOSIS — H353222 Exudative age-related macular degeneration, left eye, with inactive choroidal neovascularization: Secondary | ICD-10-CM | POA: Diagnosis not present

## 2016-09-04 DIAGNOSIS — H35361 Drusen (degenerative) of macula, right eye: Secondary | ICD-10-CM | POA: Diagnosis not present

## 2016-09-04 DIAGNOSIS — H353134 Nonexudative age-related macular degeneration, bilateral, advanced atrophic with subfoveal involvement: Secondary | ICD-10-CM | POA: Diagnosis not present

## 2016-09-12 ENCOUNTER — Other Ambulatory Visit: Payer: Self-pay | Admitting: *Deleted

## 2016-09-12 MED ORDER — CELECOXIB 100 MG PO CAPS: 100.0000 mg | ORAL_CAPSULE | Freq: Every day | ORAL | 2 refills | Status: DC | PRN

## 2016-09-12 NOTE — Telephone Encounter (Signed)
Refill request received via fax  Last Visit: 08/16/16 Next Visit: 01/17/17 Labs: 08/28/16 WNL  Okay to refill per Dr. Estanislado Pandy

## 2016-10-20 ENCOUNTER — Other Ambulatory Visit: Payer: Self-pay | Admitting: Radiology

## 2016-10-20 NOTE — Progress Notes (Signed)
Infusion orders are current for patient CBC CMP Tylenol Benadryl appointments are up to date and follow up appointment  is scheduled TB gold not due yet, due on 03/2017

## 2016-10-23 ENCOUNTER — Other Ambulatory Visit: Payer: Self-pay | Admitting: Rheumatology

## 2016-10-24 ENCOUNTER — Ambulatory Visit (HOSPITAL_COMMUNITY)
Admission: RE | Admit: 2016-10-24 | Discharge: 2016-10-24 | Disposition: A | Discharge status: Home / Self Care | Payer: Medicare Other | Source: Ambulatory Visit | Attending: Rheumatology | Admitting: Rheumatology

## 2016-10-24 DIAGNOSIS — M0579 Rheumatoid arthritis with rheumatoid factor of multiple sites without organ or systems involvement: Secondary | ICD-10-CM | POA: Insufficient documentation

## 2016-10-24 LAB — COMPREHENSIVE METABOLIC PANEL
ALT: 12 U/L — AB (ref 14–54)
AST: 23 U/L (ref 15–41)
Albumin: 3.6 g/dL (ref 3.5–5.0)
Alkaline Phosphatase: 57 U/L (ref 38–126)
Anion gap: 7 (ref 5–15)
BILIRUBIN TOTAL: 0.8 mg/dL (ref 0.3–1.2)
BUN: 9 mg/dL (ref 6–20)
CALCIUM: 9.2 mg/dL (ref 8.9–10.3)
CO2: 29 mmol/L (ref 22–32)
CREATININE: 0.74 mg/dL (ref 0.44–1.00)
Chloride: 101 mmol/L (ref 101–111)
Glucose, Bld: 88 mg/dL (ref 65–99)
Potassium: 4.2 mmol/L (ref 3.5–5.1)
Sodium: 137 mmol/L (ref 135–145)
TOTAL PROTEIN: 5.7 g/dL — AB (ref 6.5–8.1)

## 2016-10-24 LAB — CBC WITH DIFFERENTIAL/PLATELET
BASOS ABS: 0 10*3/uL (ref 0.0–0.1)
Basophils Relative: 1 %
EOS PCT: 3 %
Eosinophils Absolute: 0.2 10*3/uL (ref 0.0–0.7)
HEMATOCRIT: 41 % (ref 36.0–46.0)
Hemoglobin: 13.7 g/dL (ref 12.0–15.0)
LYMPHS ABS: 1.4 10*3/uL (ref 0.7–4.0)
LYMPHS PCT: 27 %
MCH: 31.4 pg (ref 26.0–34.0)
MCHC: 33.4 g/dL (ref 30.0–36.0)
MCV: 93.8 fL (ref 78.0–100.0)
MONO ABS: 0.6 10*3/uL (ref 0.1–1.0)
Monocytes Relative: 12 %
NEUTROS ABS: 3.1 10*3/uL (ref 1.7–7.7)
Neutrophils Relative %: 57 %
PLATELETS: 161 10*3/uL (ref 150–400)
RBC: 4.37 MIL/uL (ref 3.87–5.11)
RDW: 13.7 % (ref 11.5–15.5)
WBC: 5.3 10*3/uL (ref 4.0–10.5)

## 2016-10-24 MED ORDER — SODIUM CHLORIDE 0.9 % IV SOLN
2.0000 mg/kg | INTRAVENOUS | Status: DC
  Administered 2016-10-24: 10:00:00 136.25 mg via INTRAVENOUS
  Filled 2016-10-24: qty 10.9

## 2016-10-24 MED ORDER — DIPHENHYDRAMINE HCL 25 MG PO CAPS: 25.0000 mg | ORAL_CAPSULE | ORAL | Status: DC

## 2016-10-24 MED ORDER — ACETAMINOPHEN 325 MG PO TABS: 650.0000 mg | ORAL_TABLET | ORAL | Status: DC

## 2016-10-24 NOTE — Telephone Encounter (Signed)
Last Visit: 08/16/16 Next Visit: 01/17/17 Labs: 08/28/16 WNL  Okay to refill per Dr. Estanislado Pandy

## 2016-10-24 NOTE — Progress Notes (Signed)
WNL

## 2016-10-27 DIAGNOSIS — Z9181 History of falling: Secondary | ICD-10-CM | POA: Diagnosis not present

## 2016-10-27 DIAGNOSIS — E7800 Pure hypercholesterolemia, unspecified: Secondary | ICD-10-CM | POA: Diagnosis not present

## 2016-10-27 DIAGNOSIS — Z79899 Other long term (current) drug therapy: Secondary | ICD-10-CM | POA: Diagnosis not present

## 2016-10-27 DIAGNOSIS — I10 Essential (primary) hypertension: Secondary | ICD-10-CM | POA: Diagnosis not present

## 2016-10-27 DIAGNOSIS — F329 Major depressive disorder, single episode, unspecified: Secondary | ICD-10-CM | POA: Diagnosis not present

## 2016-10-27 DIAGNOSIS — Z6823 Body mass index (BMI) 23.0-23.9, adult: Secondary | ICD-10-CM | POA: Diagnosis not present

## 2016-10-27 DIAGNOSIS — F0390 Unspecified dementia without behavioral disturbance: Secondary | ICD-10-CM | POA: Diagnosis not present

## 2016-10-27 DIAGNOSIS — D509 Iron deficiency anemia, unspecified: Secondary | ICD-10-CM | POA: Diagnosis not present

## 2016-10-27 DIAGNOSIS — K219 Gastro-esophageal reflux disease without esophagitis: Secondary | ICD-10-CM | POA: Diagnosis not present

## 2016-11-08 DIAGNOSIS — Z136 Encounter for screening for cardiovascular disorders: Secondary | ICD-10-CM | POA: Diagnosis not present

## 2016-11-08 DIAGNOSIS — E785 Hyperlipidemia, unspecified: Secondary | ICD-10-CM | POA: Diagnosis not present

## 2016-11-08 DIAGNOSIS — Z9181 History of falling: Secondary | ICD-10-CM | POA: Diagnosis not present

## 2016-11-08 DIAGNOSIS — Z1389 Encounter for screening for other disorder: Secondary | ICD-10-CM | POA: Diagnosis not present

## 2016-11-08 DIAGNOSIS — Z0000 Encounter for general adult medical examination without abnormal findings: Secondary | ICD-10-CM | POA: Diagnosis not present

## 2016-11-13 ENCOUNTER — Telehealth: Payer: Self-pay | Admitting: Rheumatology

## 2016-11-13 NOTE — Telephone Encounter (Signed)
Patient last had a knee injection 07/2015. Patient has been scheduled for 11/15/16 @ 11:15 am.

## 2016-11-13 NOTE — Telephone Encounter (Signed)
Patient's son called about the right knee pain that patient is experiencing. Patient states she has had a knee injection in the past but the son does not know if that is the case because she has dementia. Please call patient's son back at [phone]

## 2016-11-14 NOTE — Progress Notes (Signed)
Office Visit Note  Patient: Brittany Solomon             Date of Birth: 1925-06-15           MRN: [id number]             PCP: Cyndi Bender, PA-C Referring: Cyndi Bender, PA-C Visit Date: 11/15/2016 Occupation: @GUAROCC @    Subjective:  Right knee pain   History of Present Illness: Brittany Solomon is a 83 y.o. female with history of rheumatoid arthritis and osteoarthritis. She states her rheumatoid arthritis has been doing well on combination of Simponi and methotrexate. Her right knee joint has been hurting for the last few days. She's having difficulty walking. She has some discomfort in her lower back.  Activities of Daily Living:  Patient reports morning stiffness for 1 hour.   Patient Denies nocturnal pain.  Difficulty dressing/grooming: Denies Difficulty climbing stairs: Reports Difficulty getting out of chair: Reports Difficulty using hands for taps, buttons, cutlery, and/or writing: Reports   Review of Systems  Constitutional: Positive for fatigue. Negative for night sweats, weight gain, weight loss and weakness.  HENT: Positive for mouth dryness. Negative for mouth sores, trouble swallowing, trouble swallowing and nose dryness.   Eyes: Negative for pain, redness, visual disturbance and dryness.  Respiratory: Negative for cough, shortness of breath and difficulty breathing.   Cardiovascular: Positive for hypertension. Negative for chest pain, palpitations, irregular heartbeat and swelling in legs/feet.  Gastrointestinal: Negative for blood in stool, constipation and diarrhea.  Endocrine: Negative for increased urination.  Genitourinary: Negative for vaginal dryness.  Musculoskeletal: Positive for arthralgias, joint pain and morning stiffness. Negative for joint swelling, myalgias, muscle weakness, muscle tenderness and myalgias.  Skin: Negative for color change, rash, hair loss, skin tightness, ulcers and sensitivity to sunlight.  Allergic/Immunologic: Negative for  susceptible to infections.  Neurological: Negative for dizziness, memory loss and night sweats.  Hematological: Negative for swollen glands.  Psychiatric/Behavioral: Negative for depressed mood and sleep disturbance. The patient is not nervous/anxious.     PMFS History:  Patient Active Problem List   Diagnosis Date Noted  . History of total replacement of right ankle 04/17/2016  . Primary osteoarthritis of both hands 04/10/2016  . Primary osteoarthritis of both knees 04/10/2016  . Primary osteoarthritis of both feet 04/10/2016  . Spondylosis of lumbar region without myelopathy or radiculopathy 04/10/2016  . Age-related osteoporosis without current pathological fracture 04/10/2016  . High risk medication use 04/10/2016  . History of glaucoma 04/10/2016  . History of dementia 04/10/2016  . History of anemia 04/10/2016  . History of hyperlipidemia 04/10/2016  . Primary osteoarthritis of right ankle 04/10/2016  . Rheumatoid arthritis of multiple sites without organ or system involvement with positive rheumatoid factor (Brittany Solomon) 10/25/2015    Past Medical History:  Diagnosis Date  . Anemia   . Benign neoplasm of colon   . Depression   . Diverticulitis   . Hearing impairment   . Hypercholesteremia   . Hypertension   . Memory loss   . Osteoporosis   . Rheumatoid arthritis ([place])   . Shingles   . Spinal stenosis     Family History  Problem Relation Age of Onset  . Parkinsonism Mother   . Heart attack Father    Past Surgical History:  Procedure Laterality Date  . ANKLE SURGERY    . BACK SURGERY    . GALLBLADDER SURGERY    . TONSILLECTOMY AND ADENOIDECTOMY    . VAGINAL HYSTERECTOMY  Social History   Social History Narrative   Patient lives at home alone.   Caffeine Use: none     Objective: Vital Signs: BP (!) 159/73   Pulse 84    Physical Exam  Constitutional: She is oriented to person, place, and time. She appears well-developed and well-nourished.  HENT:  Head:  Normocephalic and atraumatic.  Eyes: Conjunctivae and EOM are normal.  Neck: Normal range of motion.  Cardiovascular: Normal rate, regular rhythm, normal heart sounds and intact distal pulses.   Pulmonary/Chest: Effort normal and breath sounds normal.  Abdominal: Soft. Bowel sounds are normal.  Lymphadenopathy:    She has no cervical adenopathy.  Neurological: She is alert and oriented to person, place, and time.  Skin: Skin is warm and dry. Capillary refill takes less than 2 seconds.  Psychiatric: She has a normal mood and affect. Her behavior is normal.  Nursing note and vitals reviewed.    Musculoskeletal Exam: C-spine limited range of motion. Thoracic spine and lumbar spine limited range of motion. Shoulder joints elbow joints wrist joints are good range of motion. She is some synovial thickening and PIP/DIP dominance but no synovitis was noted. Hip joints knee joints ankles with good range of motion. She is warmth and some swelling in her right knee joint, no effusion was noted.  CDAI Exam: CDAI Homunculus Exam:   Tenderness:  RLE: tibiofemoral  Joint Counts:  CDAI Tender Joint count: 1 CDAI Swollen Joint count: 0  Global Assessments:  Patient Global Assessment: 4 Provider Global Assessment: 4  CDAI Calculated Score: 9    Investigation: No additional findings. CBC Latest Ref Rng & Units 10/24/2016 08/28/2016 07/03/2016  WBC 4.0 - 10.5 K/uL 5.3 4.6 5.0  Hemoglobin 12.0 - 15.0 g/dL 13.7 14.5 14.2  Hematocrit 36.0 - 46.0 % 41.0 44.6 42.4  Platelets 150 - 400 K/uL 161 169 162   CMP     Component Value Date/Time   NA 137 10/24/2016 0922   K 4.2 10/24/2016 0922   CL 101 10/24/2016 0922   CO2 29 10/24/2016 0922   GLUCOSE 88 10/24/2016 0922   BUN 9 10/24/2016 0922   CREATININE 0.74 10/24/2016 0922   CREATININE 0.79 04/18/2016 1020   CALCIUM 9.2 10/24/2016 0922   PROT 5.7 (L) 10/24/2016 0922   ALBUMIN 3.6 10/24/2016 0922   AST 23 10/24/2016 0922   ALT 12 (L) 10/24/2016  0922   ALKPHOS 57 10/24/2016 0922   BILITOT 0.8 10/24/2016 0922   GFRNONAA >60 10/24/2016 0922   GFRNONAA 67 04/18/2016 1020   GFRAA >60 10/24/2016 0922   GFRAA 77 04/18/2016 1020    Imaging: No results found.  Speciality Comments: No specialty comments available.    Procedures:  Large Joint Inj Date/Time: 11/15/2016 12:50 PM Performed by: Bo Merino Authorized by: Bo Merino   Consent Given by:  Patient Site marked: the procedure site was marked   Timeout: prior to procedure the correct patient, procedure, and site was verified   Indications:  Pain and joint swelling Location:  Knee Site:  R knee Prep: patient was prepped and draped in usual sterile fashion   Needle Size:  27 G Needle Length:  1.5 inches Approach:  Medial Ultrasound Guidance: No   Fluoroscopic Guidance: No   Arthrogram: No   Medications:  1.5 mL lidocaine 1 %; 40 mg triamcinolone acetonide 40 MG/ML Aspiration Attempted: Yes   Aspirate amount (mL):  0 Patient tolerance:  Patient tolerated the procedure well with no immediate complications  Allergies: Amoxicillin   Assessment / Plan:     Visit Diagnoses: Rheumatoid arthritis of multiple sites without organ or system involvement with positive rheumatoid factor ([place]): She is doing quite well as regards to rheumatoid arthritis with no synovitis on examination.  High risk medication use - Simponi Aria IV,MTX 4 tabs po q wk, Folic acid 1 mg po qd, Celebrex 200 mg po qd her labs have been stable. I will discourage the use of Celebrex.  Primary osteoarthritis of both hands: She has some DIP PIP thickening.  Primary osteoarthritis of both knees: She's having increased right knee joint pain and some warmth on palpation. Per patient's request right knee joint was injected with cortisone.  Primary osteoarthritis of both feet: Proper fitting shoes were discussed.  History of total replacement of right ankle: Doing well  Spondylosis of lumbar  region without myelopathy or radiculopathy: Pain is tolerable  Age-related osteoporosis without current pathological fracture  Other medical problems are listed as follows: History of hyperlipidemia  History of glaucoma  History of dementia    Orders: No orders of the defined types were placed in this encounter.  No orders of the defined types were placed in this encounter.     Follow-Up Instructions: Return in about 5 months (around 04/17/2017) for Rheumatoid arthritis, Osteoarthritis.   Bo Merino, MD  Note - This record has been created using Editor, commissioning.  Chart creation errors have been sought, but may not always  have been located. Such creation errors do not reflect on  the standard of medical care.

## 2016-11-15 ENCOUNTER — Ambulatory Visit (INDEPENDENT_AMBULATORY_CARE_PROVIDER_SITE_OTHER): Payer: Medicare Other | Admitting: Rheumatology

## 2016-11-15 VITALS — BP 159/73 | HR 84

## 2016-11-15 DIAGNOSIS — Z96661 Presence of right artificial ankle joint: Secondary | ICD-10-CM | POA: Diagnosis not present

## 2016-11-15 DIAGNOSIS — M810 Age-related osteoporosis without current pathological fracture: Secondary | ICD-10-CM | POA: Diagnosis not present

## 2016-11-15 DIAGNOSIS — Z8639 Personal history of other endocrine, nutritional and metabolic disease: Secondary | ICD-10-CM

## 2016-11-15 DIAGNOSIS — M19071 Primary osteoarthritis, right ankle and foot: Secondary | ICD-10-CM | POA: Diagnosis not present

## 2016-11-15 DIAGNOSIS — Z8669 Personal history of other diseases of the nervous system and sense organs: Secondary | ICD-10-CM | POA: Diagnosis not present

## 2016-11-15 DIAGNOSIS — M0579 Rheumatoid arthritis with rheumatoid factor of multiple sites without organ or systems involvement: Secondary | ICD-10-CM | POA: Diagnosis not present

## 2016-11-15 DIAGNOSIS — M47816 Spondylosis without myelopathy or radiculopathy, lumbar region: Secondary | ICD-10-CM | POA: Diagnosis not present

## 2016-11-15 DIAGNOSIS — M19042 Primary osteoarthritis, left hand: Secondary | ICD-10-CM | POA: Diagnosis not present

## 2016-11-15 DIAGNOSIS — M19041 Primary osteoarthritis, right hand: Secondary | ICD-10-CM | POA: Diagnosis not present

## 2016-11-15 DIAGNOSIS — M170 Bilateral primary osteoarthritis of knee: Secondary | ICD-10-CM

## 2016-11-15 DIAGNOSIS — Z79899 Other long term (current) drug therapy: Secondary | ICD-10-CM | POA: Diagnosis not present

## 2016-11-15 DIAGNOSIS — M19072 Primary osteoarthritis, left ankle and foot: Secondary | ICD-10-CM | POA: Diagnosis not present

## 2016-11-15 DIAGNOSIS — Z8659 Personal history of other mental and behavioral disorders: Secondary | ICD-10-CM | POA: Diagnosis not present

## 2016-11-15 MED ORDER — TRIAMCINOLONE ACETONIDE 40 MG/ML IJ SUSP
40.0000 mg | INTRAMUSCULAR | Status: AC | PRN
  Administered 2016-11-15: 40 mg via INTRA_ARTICULAR

## 2016-11-15 MED ORDER — LIDOCAINE HCL 1 % IJ SOLN
1.5000 mL | INTRAMUSCULAR | Status: AC | PRN
  Administered 2016-11-15: 1.5 mL

## 2016-11-29 ENCOUNTER — Other Ambulatory Visit: Payer: Self-pay | Admitting: Rheumatology

## 2016-11-29 NOTE — Progress Notes (Signed)
Infusion orders are current for patient CBC CMP Tylenol Benadryl appointments are up to date and follow up appointment  is scheduled TB gold not due yet, due on  Feb 2019

## 2016-12-18 ENCOUNTER — Ambulatory Visit (HOSPITAL_COMMUNITY)
Admission: RE | Admit: 2016-12-18 | Discharge: 2016-12-18 | Disposition: A | Discharge status: Home / Self Care | Payer: Medicare Other | Source: Ambulatory Visit | Attending: Rheumatology | Admitting: Rheumatology

## 2016-12-18 DIAGNOSIS — M0579 Rheumatoid arthritis with rheumatoid factor of multiple sites without organ or systems involvement: Secondary | ICD-10-CM | POA: Insufficient documentation

## 2016-12-18 MED ORDER — ACETAMINOPHEN 325 MG PO TABS: 650.0000 mg | ORAL_TABLET | ORAL | Status: DC

## 2016-12-18 MED ORDER — SODIUM CHLORIDE 0.9 % IV SOLN: INTRAVENOUS | Status: DC

## 2016-12-18 MED ORDER — GOLIMUMAB 50 MG/4ML IV SOLN
2.0000 mg/kg | INTRAVENOUS | Status: DC
Start: 2016-12-19 — End: 2016-12-18
  Administered 2016-12-18: 136.25 mg via INTRAVENOUS
  Filled 2016-12-18: qty 10.9

## 2016-12-18 MED ORDER — DIPHENHYDRAMINE HCL 25 MG PO CAPS: 25.0000 mg | ORAL_CAPSULE | ORAL | Status: DC

## 2016-12-18 MED FILL — Golimumab IV Soln 50 MG/4ML: INTRAVENOUS | Qty: 10.9 | Status: AC

## 2016-12-18 MED FILL — Sodium Chloride IV Soln 0.9%: INTRAVENOUS | Qty: 100 | Status: AC

## 2016-12-27 DIAGNOSIS — Z6822 Body mass index (BMI) 22.0-22.9, adult: Secondary | ICD-10-CM | POA: Diagnosis not present

## 2016-12-27 DIAGNOSIS — N390 Urinary tract infection, site not specified: Secondary | ICD-10-CM | POA: Diagnosis not present

## 2017-01-01 DIAGNOSIS — J111 Influenza due to unidentified influenza virus with other respiratory manifestations: Secondary | ICD-10-CM | POA: Diagnosis not present

## 2017-01-01 DIAGNOSIS — Z6823 Body mass index (BMI) 23.0-23.9, adult: Secondary | ICD-10-CM | POA: Diagnosis not present

## 2017-01-03 ENCOUNTER — Other Ambulatory Visit: Payer: Self-pay | Admitting: Rheumatology

## 2017-01-05 NOTE — Telephone Encounter (Signed)
Last Visit: 11/15/16 Next Visit: 04/17/17 Labs: 10/24/16 WNL  Okay to refill per Dr. Estanislado Pandy

## 2017-01-08 ENCOUNTER — Telehealth: Payer: Self-pay

## 2017-01-08 NOTE — Telephone Encounter (Signed)
Called patient to verify her insurance for 2019. If her plan(s) are changing we will need to run a benefits investigation for her infusion services.   Left message for patient to call back.   Carlee Tesfaye, [place], CPhT 2:18 PM

## 2017-01-17 ENCOUNTER — Ambulatory Visit: Payer: Medicare Other | Admitting: Rheumatology

## 2017-02-19 ENCOUNTER — Ambulatory Visit (HOSPITAL_COMMUNITY)
Admission: RE | Admit: 2017-02-19 | Discharge: 2017-02-19 | Disposition: A | Discharge status: Home / Self Care | Payer: Medicare Other | Source: Ambulatory Visit | Attending: Rheumatology | Admitting: Rheumatology

## 2017-02-19 DIAGNOSIS — M0579 Rheumatoid arthritis with rheumatoid factor of multiple sites without organ or systems involvement: Secondary | ICD-10-CM | POA: Diagnosis not present

## 2017-02-19 LAB — COMPREHENSIVE METABOLIC PANEL
ALBUMIN: 3.8 g/dL (ref 3.5–5.0)
ALT: 11 U/L — ABNORMAL LOW (ref 14–54)
ANION GAP: 8 (ref 5–15)
AST: 24 U/L (ref 15–41)
Alkaline Phosphatase: 56 U/L (ref 38–126)
BILIRUBIN TOTAL: 0.8 mg/dL (ref 0.3–1.2)
BUN: 12 mg/dL (ref 6–20)
CO2: 29 mmol/L (ref 22–32)
CREATININE: 0.74 mg/dL (ref 0.44–1.00)
Calcium: 9.1 mg/dL (ref 8.9–10.3)
Chloride: 100 mmol/L — ABNORMAL LOW (ref 101–111)
Glucose, Bld: 79 mg/dL (ref 65–99)
POTASSIUM: 3.6 mmol/L (ref 3.5–5.1)
Sodium: 137 mmol/L (ref 135–145)
Total Protein: 6.2 g/dL — ABNORMAL LOW (ref 6.5–8.1)

## 2017-02-19 LAB — CBC WITH DIFFERENTIAL/PLATELET
BASOS PCT: 1 %
Basophils Absolute: 0 10*3/uL (ref 0.0–0.1)
Eosinophils Absolute: 0.1 10*3/uL (ref 0.0–0.7)
Eosinophils Relative: 3 %
HEMATOCRIT: 41.1 % (ref 36.0–46.0)
Hemoglobin: 13.7 g/dL (ref 12.0–15.0)
LYMPHS ABS: 1.3 10*3/uL (ref 0.7–4.0)
Lymphocytes Relative: 31 %
MCH: 32.3 pg (ref 26.0–34.0)
MCHC: 33.3 g/dL (ref 30.0–36.0)
MCV: 96.9 fL (ref 78.0–100.0)
MONO ABS: 0.4 10*3/uL (ref 0.1–1.0)
MONOS PCT: 9 %
NEUTROS ABS: 2.4 10*3/uL (ref 1.7–7.7)
Neutrophils Relative %: 56 %
Platelets: 183 10*3/uL (ref 150–400)
RBC: 4.24 MIL/uL (ref 3.87–5.11)
RDW: 14.2 % (ref 11.5–15.5)
WBC: 4.2 10*3/uL (ref 4.0–10.5)

## 2017-02-19 MED ORDER — SODIUM CHLORIDE 0.9 % IV SOLN
2.0000 mg/kg | INTRAVENOUS | Status: AC
  Administered 2017-02-19: 136.25 mg via INTRAVENOUS
  Filled 2017-02-19: qty 10.9

## 2017-02-19 MED ORDER — DIPHENHYDRAMINE HCL 25 MG PO CAPS: 25.0000 mg | ORAL_CAPSULE | ORAL | Status: DC

## 2017-02-19 MED ORDER — ACETAMINOPHEN 325 MG PO TABS: 650.0000 mg | ORAL_TABLET | ORAL | Status: DC

## 2017-02-19 NOTE — Progress Notes (Signed)
stable °

## 2017-03-19 ENCOUNTER — Other Ambulatory Visit: Payer: Self-pay | Admitting: Rheumatology

## 2017-03-19 ENCOUNTER — Telehealth: Payer: Self-pay

## 2017-03-19 NOTE — Telephone Encounter (Signed)
Last Visit: 11/15/16 Next Visit: 04/17/17 Labs: 02/19/17 stable  Okay to refill per Dr. Estanislado Pandy

## 2017-03-19 NOTE — Telephone Encounter (Signed)
Called pt to verify benefits for 2019. Spoke with her son Ritchie who states she will continue to have Medicare and BCBS Supplement. Neither plan will require a pre-cert.  Angelamarie Avakian, [place], CPhT 12:09 PM

## 2017-03-23 DIAGNOSIS — Z6822 Body mass index (BMI) 22.0-22.9, adult: Secondary | ICD-10-CM | POA: Diagnosis not present

## 2017-03-23 DIAGNOSIS — L89891 Pressure ulcer of other site, stage 1: Secondary | ICD-10-CM | POA: Diagnosis not present

## 2017-03-30 ENCOUNTER — Other Ambulatory Visit: Payer: Self-pay | Admitting: *Deleted

## 2017-03-30 NOTE — Progress Notes (Signed)
Infusion orders are current for patient CBC CMP Tylenol Benadryl appointments are up to date and follow up appointment  is scheduled TB gold now.

## 2017-04-03 NOTE — Progress Notes (Deleted)
Office Visit Note  Patient: Brittany Solomon             Date of Birth: 01-05-1926           MRN: [id number]             PCP: Cyndi Bender, PA-C Referring: Cyndi Bender, PA-C Visit Date: 04/17/2017 Occupation: @GUAROCC @    Subjective:  No chief complaint on file.   History of Present Illness: Brittany Solomon is a 84 y.o. female ***   Activities of Daily Living:  Patient reports morning stiffness for *** {minute/hour:19697}.   Patient {ACTIONS;DENIES/REPORTS:21021675::"Denies"} nocturnal pain.  Difficulty dressing/grooming: {ACTIONS;DENIES/REPORTS:21021675::"Denies"} Difficulty climbing stairs: {ACTIONS;DENIES/REPORTS:21021675::"Denies"} Difficulty getting out of chair: {ACTIONS;DENIES/REPORTS:21021675::"Denies"} Difficulty using hands for taps, buttons, cutlery, and/or writing: {ACTIONS;DENIES/REPORTS:21021675::"Denies"}   No Rheumatology ROS completed.   PMFS History:  Patient Active Problem List   Diagnosis Date Noted  . History of total replacement of right ankle 04/17/2016  . Primary osteoarthritis of both hands 04/10/2016  . Primary osteoarthritis of both knees 04/10/2016  . Primary osteoarthritis of both feet 04/10/2016  . Spondylosis of lumbar region without myelopathy or radiculopathy 04/10/2016  . Age-related osteoporosis without current pathological fracture 04/10/2016  . High risk medication use 04/10/2016  . History of glaucoma 04/10/2016  . History of dementia 04/10/2016  . History of anemia 04/10/2016  . History of hyperlipidemia 04/10/2016  . Primary osteoarthritis of right ankle 04/10/2016  . Rheumatoid arthritis of multiple sites without organ or system involvement with positive rheumatoid factor ([place]) 10/25/2015    Past Medical History:  Diagnosis Date  . Anemia   . Benign neoplasm of colon   . Depression   . Diverticulitis   . Hearing impairment   . Hypercholesteremia   . Hypertension   . Memory loss   . Osteoporosis   . Rheumatoid arthritis  ([place])   . Shingles   . Spinal stenosis     Family History  Problem Relation Age of Onset  . Parkinsonism Mother   . Heart attack Father    Past Surgical History:  Procedure Laterality Date  . ANKLE SURGERY    . BACK SURGERY    . GALLBLADDER SURGERY    . TONSILLECTOMY AND ADENOIDECTOMY    . VAGINAL HYSTERECTOMY     Social History   Social History Narrative   Patient lives at home alone.   Caffeine Use: none     Objective: Vital Signs: There were no vitals taken for this visit.   Physical Exam   Musculoskeletal Exam: ***  CDAI Exam: No CDAI exam completed.    Investigation: No additional findings.TB Gold: 04/18/2016 Negative  CBC Latest Ref Rng & Units 02/19/2017 10/24/2016 08/28/2016  WBC 4.0 - 10.5 K/uL 4.2 5.3 4.6  Hemoglobin 12.0 - 15.0 g/dL 13.7 13.7 14.5  Hematocrit 36.0 - 46.0 % 41.1 41.0 44.6  Platelets 150 - 400 K/uL 183 161 169   CMP Latest Ref Rng & Units 02/19/2017 10/24/2016 08/28/2016  Glucose 65 - 99 mg/dL 79 88 84  BUN 6 - 20 mg/dL 12 9 12   Creatinine 0.44 - 1.00 mg/dL 0.74 0.74 0.78  Sodium 135 - 145 mmol/L 137 137 137  Potassium 3.5 - 5.1 mmol/L 3.6 4.2 3.7  Chloride 101 - 111 mmol/L 100(L) 101 100(L)  CO2 22 - 32 mmol/L 29 29 29   Calcium 8.9 - 10.3 mg/dL 9.1 9.2 9.3  Total Protein 6.5 - 8.1 g/dL 6.2(L) 5.7(L) 6.5  Total Bilirubin 0.3 - 1.2 mg/dL 0.8 0.8  0.7  Alkaline Phos 38 - 126 U/L 56 57 71  AST 15 - 41 U/L 24 23 26   ALT 14 - 54 U/L 11(L) 12(L) 12(L)    Imaging: No results found.  Speciality Comments: Simponi Aria infusions every 8 weeks TB gold negative 04/18/2016    Procedures:  No procedures performed Allergies: Amoxicillin   Assessment / Plan:     Visit Diagnoses: No diagnosis found.    Orders: No orders of the defined types were placed in this encounter.  No orders of the defined types were placed in this encounter.   Face-to-face time spent with patient was *** minutes. 50% of time was spent in counseling and  coordination of care.  Follow-Up Instructions: No Follow-up on file.   Earnestine Mealing, CMA  Note - This record has been created using Editor, commissioning.  Chart creation errors have been sought, but may not always  have been located. Such creation errors do not reflect on  the standard of medical care.

## 2017-04-08 ENCOUNTER — Encounter (HOSPITAL_COMMUNITY): Payer: Self-pay | Admitting: *Deleted

## 2017-04-08 ENCOUNTER — Emergency Department (HOSPITAL_COMMUNITY): Payer: Medicare Other

## 2017-04-08 ENCOUNTER — Inpatient Hospital Stay (HOSPITAL_COMMUNITY)
Admission: EM | Admit: 2017-04-08 | Discharge: 2017-04-12 | DRG: 536 | Disposition: A | Discharge status: Skilled Nursing Facility | Payer: Medicare Other | Attending: Internal Medicine | Admitting: Internal Medicine

## 2017-04-08 ENCOUNTER — Other Ambulatory Visit: Payer: Self-pay

## 2017-04-08 DIAGNOSIS — Z880 Allergy status to penicillin: Secondary | ICD-10-CM | POA: Diagnosis not present

## 2017-04-08 DIAGNOSIS — R278 Other lack of coordination: Secondary | ICD-10-CM | POA: Diagnosis not present

## 2017-04-08 DIAGNOSIS — G8911 Acute pain due to trauma: Secondary | ICD-10-CM | POA: Diagnosis not present

## 2017-04-08 DIAGNOSIS — D696 Thrombocytopenia, unspecified: Secondary | ICD-10-CM | POA: Diagnosis present

## 2017-04-08 DIAGNOSIS — S32592A Other specified fracture of left pubis, initial encounter for closed fracture: Principal | ICD-10-CM | POA: Diagnosis present

## 2017-04-08 DIAGNOSIS — F0390 Unspecified dementia without behavioral disturbance: Secondary | ICD-10-CM | POA: Diagnosis present

## 2017-04-08 DIAGNOSIS — R413 Other amnesia: Secondary | ICD-10-CM | POA: Diagnosis present

## 2017-04-08 DIAGNOSIS — S3282XD Multiple fractures of pelvis without disruption of pelvic ring, subsequent encounter for fracture with routine healing: Secondary | ICD-10-CM | POA: Diagnosis not present

## 2017-04-08 DIAGNOSIS — F0280 Dementia in other diseases classified elsewhere without behavioral disturbance: Secondary | ICD-10-CM | POA: Diagnosis not present

## 2017-04-08 DIAGNOSIS — M810 Age-related osteoporosis without current pathological fracture: Secondary | ICD-10-CM | POA: Diagnosis present

## 2017-04-08 DIAGNOSIS — M069 Rheumatoid arthritis, unspecified: Secondary | ICD-10-CM | POA: Diagnosis present

## 2017-04-08 DIAGNOSIS — Z8659 Personal history of other mental and behavioral disorders: Secondary | ICD-10-CM

## 2017-04-08 DIAGNOSIS — E876 Hypokalemia: Secondary | ICD-10-CM | POA: Diagnosis not present

## 2017-04-08 DIAGNOSIS — E871 Hypo-osmolality and hyponatremia: Secondary | ICD-10-CM | POA: Clinically undetermined

## 2017-04-08 DIAGNOSIS — D72828 Other elevated white blood cell count: Secondary | ICD-10-CM | POA: Diagnosis present

## 2017-04-08 DIAGNOSIS — R41841 Cognitive communication deficit: Secondary | ICD-10-CM | POA: Diagnosis not present

## 2017-04-08 DIAGNOSIS — M4800 Spinal stenosis, site unspecified: Secondary | ICD-10-CM | POA: Diagnosis present

## 2017-04-08 DIAGNOSIS — Y92009 Unspecified place in unspecified non-institutional (private) residence as the place of occurrence of the external cause: Secondary | ICD-10-CM

## 2017-04-08 DIAGNOSIS — E785 Hyperlipidemia, unspecified: Secondary | ICD-10-CM | POA: Diagnosis present

## 2017-04-08 DIAGNOSIS — R55 Syncope and collapse: Secondary | ICD-10-CM | POA: Diagnosis present

## 2017-04-08 DIAGNOSIS — Z7982 Long term (current) use of aspirin: Secondary | ICD-10-CM

## 2017-04-08 DIAGNOSIS — S32401A Unspecified fracture of right acetabulum, initial encounter for closed fracture: Secondary | ICD-10-CM | POA: Diagnosis not present

## 2017-04-08 DIAGNOSIS — S0990XA Unspecified injury of head, initial encounter: Secondary | ICD-10-CM | POA: Diagnosis not present

## 2017-04-08 DIAGNOSIS — S32599A Other specified fracture of unspecified pubis, initial encounter for closed fracture: Secondary | ICD-10-CM | POA: Diagnosis present

## 2017-04-08 DIAGNOSIS — I10 Essential (primary) hypertension: Secondary | ICD-10-CM | POA: Diagnosis not present

## 2017-04-08 DIAGNOSIS — Z8669 Personal history of other diseases of the nervous system and sense organs: Secondary | ICD-10-CM

## 2017-04-08 DIAGNOSIS — S32519A Fracture of superior rim of unspecified pubis, initial encounter for closed fracture: Secondary | ICD-10-CM | POA: Diagnosis not present

## 2017-04-08 DIAGNOSIS — K219 Gastro-esophageal reflux disease without esophagitis: Secondary | ICD-10-CM | POA: Diagnosis present

## 2017-04-08 DIAGNOSIS — H9190 Unspecified hearing loss, unspecified ear: Secondary | ICD-10-CM | POA: Diagnosis present

## 2017-04-08 DIAGNOSIS — E860 Dehydration: Secondary | ICD-10-CM | POA: Diagnosis present

## 2017-04-08 DIAGNOSIS — R2681 Unsteadiness on feet: Secondary | ICD-10-CM | POA: Diagnosis not present

## 2017-04-08 DIAGNOSIS — S32509A Unspecified fracture of unspecified pubis, initial encounter for closed fracture: Secondary | ICD-10-CM | POA: Diagnosis not present

## 2017-04-08 DIAGNOSIS — Z791 Long term (current) use of non-steroidal anti-inflammatories (NSAID): Secondary | ICD-10-CM

## 2017-04-08 DIAGNOSIS — T502X5A Adverse effect of carbonic-anhydrase inhibitors, benzothiadiazides and other diuretics, initial encounter: Secondary | ICD-10-CM | POA: Diagnosis present

## 2017-04-08 DIAGNOSIS — S199XXA Unspecified injury of neck, initial encounter: Secondary | ICD-10-CM | POA: Diagnosis not present

## 2017-04-08 DIAGNOSIS — S3282XA Multiple fractures of pelvis without disruption of pelvic ring, initial encounter for closed fracture: Secondary | ICD-10-CM

## 2017-04-08 DIAGNOSIS — Z4789 Encounter for other orthopedic aftercare: Secondary | ICD-10-CM | POA: Diagnosis not present

## 2017-04-08 DIAGNOSIS — S32592D Other specified fracture of left pubis, subsequent encounter for fracture with routine healing: Secondary | ICD-10-CM | POA: Diagnosis not present

## 2017-04-08 DIAGNOSIS — S79911A Unspecified injury of right hip, initial encounter: Secondary | ICD-10-CM | POA: Diagnosis not present

## 2017-04-08 DIAGNOSIS — D649 Anemia, unspecified: Secondary | ICD-10-CM | POA: Diagnosis present

## 2017-04-08 DIAGNOSIS — R42 Dizziness and giddiness: Secondary | ICD-10-CM

## 2017-04-08 DIAGNOSIS — W19XXXA Unspecified fall, initial encounter: Secondary | ICD-10-CM | POA: Diagnosis present

## 2017-04-08 DIAGNOSIS — M6281 Muscle weakness (generalized): Secondary | ICD-10-CM | POA: Diagnosis not present

## 2017-04-08 DIAGNOSIS — R52 Pain, unspecified: Secondary | ICD-10-CM | POA: Diagnosis not present

## 2017-04-08 DIAGNOSIS — Z8639 Personal history of other endocrine, nutritional and metabolic disease: Secondary | ICD-10-CM | POA: Diagnosis not present

## 2017-04-08 DIAGNOSIS — M25552 Pain in left hip: Secondary | ICD-10-CM | POA: Diagnosis not present

## 2017-04-08 DIAGNOSIS — S32502D Unspecified fracture of left pubis, subsequent encounter for fracture with routine healing: Secondary | ICD-10-CM | POA: Diagnosis not present

## 2017-04-08 DIAGNOSIS — M6389 Disorders of muscle in diseases classified elsewhere, multiple sites: Secondary | ICD-10-CM | POA: Diagnosis not present

## 2017-04-08 LAB — COMPREHENSIVE METABOLIC PANEL
ALBUMIN: 4.1 g/dL (ref 3.5–5.0)
ALK PHOS: 53 U/L (ref 38–126)
ALT: 18 U/L (ref 14–54)
AST: 33 U/L (ref 15–41)
Anion gap: 10 (ref 5–15)
BUN: 10 mg/dL (ref 6–20)
CHLORIDE: 96 mmol/L — AB (ref 101–111)
CO2: 28 mmol/L (ref 22–32)
Calcium: 9.3 mg/dL (ref 8.9–10.3)
Creatinine, Ser: 0.6 mg/dL (ref 0.44–1.00)
GFR calc non Af Amer: >60 mL/min (ref >60)
GLUCOSE: 111 mg/dL — AB (ref 65–99)
POTASSIUM: 3.3 mmol/L — AB (ref 3.5–5.1)
SODIUM: 134 mmol/L — AB (ref 135–145)
Total Bilirubin: 1.3 mg/dL — ABNORMAL HIGH (ref 0.3–1.2)
Total Protein: 6.1 g/dL — ABNORMAL LOW (ref 6.5–8.1)

## 2017-04-08 LAB — CBC WITH DIFFERENTIAL/PLATELET
BASOS PCT: 0 %
Basophils Absolute: 0 10*3/uL (ref 0.0–0.1)
EOS ABS: 0 10*3/uL (ref 0.0–0.7)
EOS PCT: 0 %
HCT: 39.2 % (ref 36.0–46.0)
HEMOGLOBIN: 14 g/dL (ref 12.0–15.0)
Lymphocytes Relative: 5 %
Lymphs Abs: 0.7 10*3/uL (ref 0.7–4.0)
MCH: 33.6 pg (ref 26.0–34.0)
MCHC: 35.7 g/dL (ref 30.0–36.0)
MCV: 94 fL (ref 78.0–100.0)
MONOS PCT: 7 %
Monocytes Absolute: 1 10*3/uL (ref 0.1–1.0)
NEUTROS PCT: 88 %
Neutro Abs: 11.7 10*3/uL — ABNORMAL HIGH (ref 1.7–7.7)
PLATELETS: 148 10*3/uL — AB (ref 150–400)
RBC: 4.17 MIL/uL (ref 3.87–5.11)
RDW: 13.6 % (ref 11.5–15.5)
WBC: 13.4 10*3/uL — ABNORMAL HIGH (ref 4.0–10.5)

## 2017-04-08 LAB — URINALYSIS, ROUTINE W REFLEX MICROSCOPIC
Bilirubin Urine: NEGATIVE
GLUCOSE, UA: NEGATIVE mg/dL
Hgb urine dipstick: NEGATIVE
KETONES UR: 20 mg/dL — AB
LEUKOCYTES UA: NEGATIVE
NITRITE: NEGATIVE
PROTEIN: NEGATIVE mg/dL
Specific Gravity, Urine: 1.011 (ref 1.005–1.030)
pH: 7 (ref 5.0–8.0)

## 2017-04-08 LAB — CK: Total CK: 198 U/L (ref 38–234)

## 2017-04-08 LAB — I-STAT TROPONIN, ED: Troponin i, poc: 0 ng/mL (ref 0.00–0.08)

## 2017-04-08 MED ORDER — TRAMADOL HCL 50 MG PO TABS
50.0000 mg | ORAL_TABLET | Freq: Once | ORAL | Status: AC
  Administered 2017-04-08: 50 mg via ORAL
  Filled 2017-04-08: qty 1

## 2017-04-08 MED ORDER — PRAVASTATIN SODIUM 20 MG PO TABS
40.0000 mg | ORAL_TABLET | Freq: Every day | ORAL | Status: DC
  Administered 2017-04-09 – 2017-04-12 (×4): 40 mg via ORAL
  Filled 2017-04-08 (×4): qty 2

## 2017-04-08 MED ORDER — CALCIUM CARBONATE 1250 (500 CA) MG PO TABS
1250.0000 mg | ORAL_TABLET | Freq: Two times a day (BID) | ORAL | Status: DC
  Administered 2017-04-08 – 2017-04-12 (×5): 1250 mg via ORAL
  Filled 2017-04-08 (×5): qty 1

## 2017-04-08 MED ORDER — SODIUM CHLORIDE 0.9% FLUSH
3.0000 mL | Freq: Two times a day (BID) | INTRAVENOUS | Status: DC
  Administered 2017-04-08 – 2017-04-11 (×4): 3 mL via INTRAVENOUS

## 2017-04-08 MED ORDER — ACETAMINOPHEN 500 MG PO TABS
1000.0000 mg | ORAL_TABLET | Freq: Once | ORAL | Status: AC
  Administered 2017-04-08: 1000 mg via ORAL
  Filled 2017-04-08: qty 2

## 2017-04-08 MED ORDER — DONEPEZIL HCL 10 MG PO TABS
10.0000 mg | ORAL_TABLET | Freq: Every day | ORAL | Status: DC
  Administered 2017-04-08 – 2017-04-11 (×4): 10 mg via ORAL
  Filled 2017-04-08 (×4): qty 1

## 2017-04-08 MED ORDER — HEPARIN SODIUM (PORCINE) 5000 UNIT/ML IJ SOLN
5000.0000 [IU] | Freq: Three times a day (TID) | INTRAMUSCULAR | Status: DC
  Administered 2017-04-08 – 2017-04-12 (×11): 5000 [IU] via SUBCUTANEOUS
  Filled 2017-04-08 (×11): qty 1

## 2017-04-08 MED ORDER — HYDROCHLOROTHIAZIDE 25 MG PO TABS
25.0000 mg | ORAL_TABLET | Freq: Every day | ORAL | Status: DC
  Administered 2017-04-09 – 2017-04-11 (×3): 25 mg via ORAL
  Filled 2017-04-08 (×3): qty 1

## 2017-04-08 MED ORDER — SODIUM CHLORIDE 0.9 % IV SOLN: 250.0000 mL | INTRAVENOUS | Status: DC | PRN

## 2017-04-08 MED ORDER — POTASSIUM CHLORIDE CRYS ER 20 MEQ PO TBCR
40.0000 meq | EXTENDED_RELEASE_TABLET | Freq: Once | ORAL | Status: AC
  Administered 2017-04-08: 40 meq via ORAL
  Filled 2017-04-08: qty 2

## 2017-04-08 MED ORDER — VITAMIN E 180 MG (400 UNIT) PO CAPS
400.0000 [IU] | ORAL_CAPSULE | Freq: Every day | ORAL | Status: DC
  Administered 2017-04-09 – 2017-04-12 (×5): 400 [IU] via ORAL
  Filled 2017-04-08 (×4): qty 1

## 2017-04-08 MED ORDER — VITAMIN D 1000 UNITS PO TABS
1000.0000 [IU] | ORAL_TABLET | Freq: Every day | ORAL | Status: DC
  Administered 2017-04-09 – 2017-04-12 (×4): 1000 [IU] via ORAL
  Filled 2017-04-08 (×4): qty 1

## 2017-04-08 MED ORDER — MEMANTINE HCL 10 MG PO TABS
10.0000 mg | ORAL_TABLET | Freq: Two times a day (BID) | ORAL | Status: DC
  Administered 2017-04-08 – 2017-04-12 (×8): 10 mg via ORAL
  Filled 2017-04-08 (×8): qty 1

## 2017-04-08 MED ORDER — FENTANYL CITRATE (PF) 100 MCG/2ML IJ SOLN
25.0000 ug | INTRAMUSCULAR | Status: DC | PRN
  Administered 2017-04-08: 25 ug via INTRAVENOUS
  Filled 2017-04-08: qty 2

## 2017-04-08 MED ORDER — PAROXETINE HCL 20 MG PO TABS
20.0000 mg | ORAL_TABLET | Freq: Every day | ORAL | Status: DC
  Administered 2017-04-09 – 2017-04-12 (×4): 20 mg via ORAL
  Filled 2017-04-08 (×4): qty 1

## 2017-04-08 MED ORDER — SODIUM CHLORIDE 0.9% FLUSH: 3.0000 mL | INTRAVENOUS | Status: DC | PRN

## 2017-04-08 MED ORDER — FOLIC ACID 1 MG PO TABS
1.0000 mg | ORAL_TABLET | Freq: Every day | ORAL | Status: DC
  Administered 2017-04-09 – 2017-04-12 (×4): 1 mg via ORAL
  Filled 2017-04-08 (×4): qty 1

## 2017-04-08 MED ORDER — OXYCODONE HCL 5 MG PO TABS
5.0000 mg | ORAL_TABLET | Freq: Four times a day (QID) | ORAL | Status: DC | PRN
  Administered 2017-04-08 – 2017-04-09 (×2): 5 mg via ORAL
  Filled 2017-04-08 (×2): qty 1

## 2017-04-08 MED ORDER — PANTOPRAZOLE SODIUM 40 MG PO TBEC
40.0000 mg | DELAYED_RELEASE_TABLET | Freq: Every day | ORAL | Status: DC
  Administered 2017-04-08 – 2017-04-12 (×5): 40 mg via ORAL
  Filled 2017-04-08 (×5): qty 1

## 2017-04-08 MED ORDER — FERROUS SULFATE 325 (65 FE) MG PO TABS
325.0000 mg | ORAL_TABLET | Freq: Two times a day (BID) | ORAL | Status: DC
  Administered 2017-04-08 – 2017-04-12 (×5): 325 mg via ORAL
  Filled 2017-04-08 (×5): qty 1

## 2017-04-08 NOTE — ED Notes (Signed)
ED TO INPATIENT HANDOFF REPORT  Name/Age/Gender Brittany Solomon 84 y.o. female  Code Status Code Status History    This patient does not have a recorded code status. Please follow your organizational policy for patients in this situation.      Home/SNF/Other Home  Chief Complaint Fall  Level of Care/Admitting Diagnosis ED Disposition    ED Disposition Condition Comment   Admit  Hospital Area: [place] [[id number]]  Level of Care: Med-Surg [16]  Diagnosis: Pubic ramus fracture [facility]) [[id number]]  Admitting Physician: Velvet Bathe [phone]  Attending Physician: Velvet Bathe [phone]  Estimated length of stay: past midnight tomorrow  Certification:: I certify this patient will need inpatient services for at least 2 midnights  PT Class (Do Not Modify): Inpatient [101]  PT Acc Code (Do Not Modify): Private [1]       Medical History Past Medical History:  Diagnosis Date  . Anemia   . Benign neoplasm of colon   . Depression   . Diverticulitis   . Hearing impairment   . Hypercholesteremia   . Hypertension   . Memory loss   . Osteoporosis   . Rheumatoid arthritis ([place])   . Shingles   . Spinal stenosis     Allergies Allergies  Allergen Reactions  . Amoxicillin Rash    IV Location/Drains/Wounds Patient Lines/Drains/Airways Status   Active Line/Drains/Airways    Name:   Placement date:   Placement time:   Site:   Days:   Peripheral IV 04/08/17 Left Wrist   04/08/17    1521    Wrist   less than 1          Labs/Imaging Results for orders placed or performed during the hospital encounter of 04/08/17 (from the past 48 hour(s))  CBC with Differential     Status: Abnormal   Collection Time: 04/08/17  3:19 PM  Result Value Ref Range   WBC 13.4 (H) 4.0 - 10.5 K/uL   RBC 4.17 3.87 - 5.11 MIL/uL   Hemoglobin 14.0 12.0 - 15.0 g/dL   HCT 39.2 36.0 - 46.0 %   MCV 94.0 78.0 - 100.0 fL   MCH 33.6 26.0 - 34.0 pg   MCHC 35.7 30.0 - 36.0 g/dL   RDW 13.6 11.5  - 15.5 %   Platelets 148 (L) 150 - 400 K/uL   Neutrophils Relative % 88 %   Neutro Abs 11.7 (H) 1.7 - 7.7 K/uL   Lymphocytes Relative 5 %   Lymphs Abs 0.7 0.7 - 4.0 K/uL   Monocytes Relative 7 %   Monocytes Absolute 1.0 0.1 - 1.0 K/uL   Eosinophils Relative 0 %   Eosinophils Absolute 0.0 0.0 - 0.7 K/uL   Basophils Relative 0 %   Basophils Absolute 0.0 0.0 - 0.1 K/uL    Comment: Performed at [facility], [place] [address] [place], [place] [address]  Comprehensive metabolic panel     Status: Abnormal   Collection Time: 04/08/17  3:19 PM  Result Value Ref Range   Sodium 134 (L) 135 - 145 mmol/L   Potassium 3.3 (L) 3.5 - 5.1 mmol/L   Chloride 96 (L) 101 - 111 mmol/L   CO2 28 22 - 32 mmol/L   Glucose, Bld 111 (H) 65 - 99 mg/dL   BUN 10 6 - 20 mg/dL   Creatinine, Ser 0.60 0.44 - 1.00 mg/dL   Calcium 9.3 8.9 - 10.3 mg/dL   Total Protein 6.1 (L) 6.5 - 8.1 g/dL  Albumin 4.1 3.5 - 5.0 g/dL   AST 33 15 - 41 U/L   ALT 18 14 - 54 U/L   Alkaline Phosphatase 53 38 - 126 U/L   Total Bilirubin 1.3 (H) 0.3 - 1.2 mg/dL   GFR calc non Af Amer >60 >60 mL/min   GFR calc Af Amer >60 >60 mL/min    Comment: (NOTE) The eGFR has been calculated using the CKD EPI equation. This calculation has not been validated in all clinical situations. eGFR's persistently <60 mL/min signify possible Chronic Kidney Disease.    Anion gap 10 5 - 15    Comment: Performed at [facility], [place] [address], [place], [place] [address]  CK     Status: None   Collection Time: 04/08/17  3:19 PM  Result Value Ref Range   Total CK 198 38 - 234 U/L    Comment: Performed at [facility], [place] [address] [place], [place] [address]  I-Stat Troponin, ED (not at [facility])     Status: None   Collection Time: 04/08/17  3:27 PM  Result Value Ref Range   Troponin i, poc 0.00 0.00 - 0.08 ng/mL   Comment 3            Comment: Due to the release kinetics of cTnI, a negative result  within the first hours of the onset of symptoms does not rule out myocardial infarction with certainty. If myocardial infarction is still suspected, repeat the test at appropriate intervals.    Ct Head Wo Contrast  Result Date: 04/08/2017 CLINICAL DATA:  Fall at home while walking to bathroom. Left hip pain. EXAM: CT HEAD WITHOUT CONTRAST CT CERVICAL SPINE WITHOUT CONTRAST TECHNIQUE: Multidetector CT imaging of the head and cervical spine was performed following the standard protocol without intravenous contrast. Multiplanar CT image reconstructions of the cervical spine were also generated. COMPARISON:  None. FINDINGS: CT HEAD FINDINGS Brain: Ventricles, cisterns and other CSF spaces are within normal as there is minimal age related atrophic change. There is moderate chronic ischemic microvascular disease. There is no mass, mass effect, shift of midline structures or acute hemorrhage. No evidence of acute infarction. Minimal basal ganglia calcifications. Vascular: No hyperdense vessel or unexpected calcification. Skull: Normal. Negative for fracture or focal lesion. Sinuses/Orbits: No acute finding. Other: None. CT CERVICAL SPINE FINDINGS Alignment: Very subtle 1-2 mm anterior subluxation of C3 on C4 likely degenerative. Skull base and vertebrae: Vertebral body heights are normal. There is mild to moderate spondylosis throughout the cervical spine. Atlantoaxial articulation is within normal. There is uncovertebral joint spurring and facet arthropathy. No acute fracture. Severe bilateral neural foraminal narrowing at multiple levels due to adjacent bony spurring. Soft tissues and spinal canal: No prevertebral fluid or swelling. No visible canal hematoma. Disc levels: Moderate disc space narrowing at the C4-5, C5-6 and C6-7 levels. Upper chest: Negative. Other: None. IMPRESSION: No acute brain injury. Moderate chronic ischemic microvascular disease and mild age related atrophic change. No acute cervical  spine injury. Mild to moderate spondylosis of the cervical spine with multilevel disc disease and significant bilateral multilevel neural foraminal narrowing due to adjacent bony spurring. Electronically Signed   By: Marin Olp M.D.   On: 04/08/2017 14:55   Ct Cervical Spine Wo Contrast  Result Date: 04/08/2017 CLINICAL DATA:  Fall at home while walking to bathroom. Left hip pain. EXAM: CT HEAD WITHOUT CONTRAST CT CERVICAL SPINE WITHOUT CONTRAST TECHNIQUE: Multidetector CT imaging of the head and cervical spine was  performed following the standard protocol without intravenous contrast. Multiplanar CT image reconstructions of the cervical spine were also generated. COMPARISON:  None. FINDINGS: CT HEAD FINDINGS Brain: Ventricles, cisterns and other CSF spaces are within normal as there is minimal age related atrophic change. There is moderate chronic ischemic microvascular disease. There is no mass, mass effect, shift of midline structures or acute hemorrhage. No evidence of acute infarction. Minimal basal ganglia calcifications. Vascular: No hyperdense vessel or unexpected calcification. Skull: Normal. Negative for fracture or focal lesion. Sinuses/Orbits: No acute finding. Other: None. CT CERVICAL SPINE FINDINGS Alignment: Very subtle 1-2 mm anterior subluxation of C3 on C4 likely degenerative. Skull base and vertebrae: Vertebral body heights are normal. There is mild to moderate spondylosis throughout the cervical spine. Atlantoaxial articulation is within normal. There is uncovertebral joint spurring and facet arthropathy. No acute fracture. Severe bilateral neural foraminal narrowing at multiple levels due to adjacent bony spurring. Soft tissues and spinal canal: No prevertebral fluid or swelling. No visible canal hematoma. Disc levels: Moderate disc space narrowing at the C4-5, C5-6 and C6-7 levels. Upper chest: Negative. Other: None. IMPRESSION: No acute brain injury. Moderate chronic ischemic  microvascular disease and mild age related atrophic change. No acute cervical spine injury. Mild to moderate spondylosis of the cervical spine with multilevel disc disease and significant bilateral multilevel neural foraminal narrowing due to adjacent bony spurring. Electronically Signed   By: Marin Olp M.D.   On: 04/08/2017 14:55   Ct Pelvis Wo Contrast  Result Date: 04/08/2017 CLINICAL DATA:  Left hip pain after fall. Pubic rami fractures seen on x-ray. EXAM: CT PELVIS WITHOUT CONTRAST TECHNIQUE: Multidetector CT imaging of the pelvis was performed following the standard protocol without intravenous contrast. COMPARISON:  Left hip x-rays from same day. FINDINGS: Urinary Tract:  No abnormality visualized. Bowel: Colonic diverticulosis. Otherwise unremarkable visualized pelvic bowel loops. Vascular/Lymphatic: Mild aortic atherosclerosis. No lymphadenopathy. Reproductive:  Prior hysterectomy.  No adnexal mass. Other:  None. Musculoskeletal: Acute, transverse, nondisplaced fracture of the left puboacetabular junction. Acute, vertical, nondisplaced fracture through the left inferior pubic ramus. No additional fracture seen. Prior posterior decompression at L4-L5 and L5-S1. Severe lower lumbar degenerative disc disease and facet arthropathy. Osteopenia. IMPRESSION: 1. Acute, nondisplaced fractures of the left puboacetabular junction and left inferior pubic ramus. 2.  Aortic atherosclerosis (ICD10-I70.0). Electronically Signed   By: Titus Dubin M.D.   On: 04/08/2017 15:02   Dg Hip Unilat With Pelvis 2-3 Views Left  Result Date: 04/08/2017 CLINICAL DATA:  Fall.  Left hip pain. EXAM: DG HIP (WITH OR WITHOUT PELVIS) 2-3V LEFT COMPARISON:  DEXA scan November 02, 2014 FINDINGS: Soft tissue calcifications are seen adjacent to the right hip and overlying the right femoral neck, unchanged since the comparison DEXA scan. No acute right hip fracture based on a single frontal view. Fractures are seen in the left  pubic bones. There is a fracture through the inferior pubic ramus. There is a no other fracture through the superior ramus near its junction with the acetabulum. It is possible the fracture affects the acetabulum. The lower lumbar spine and sacrum are grossly unremarkable. No fracture seen through the left proximal femur. No dislocation. IMPRESSION: 1. There is a fracture at the base of the left superior ramus which could extend into the acetabulum. CT imaging could better evaluate. Left inferior pubic ramus fracture. No fracture of the proximal left femur. 2. Chronic changes on the right. Electronically Signed   By: Dorise Bullion III M.D  On: 04/08/2017 14:00    Pending Labs Unresulted Labs (From admission, onward)   Start     Ordered   04/08/17 1415  Urinalysis, Routine w reflex microscopic  Once,   R     04/08/17 1415   Signed and Held  CBC  (heparin)  Once,   R    Comments:  Baseline for heparin therapy IF NOT ALREADY DRAWN.  Notify MD if PLT < 100 K.    Signed and Held   Signed and Held  Creatinine, serum  (heparin)  Once,   R    Comments:  Baseline for heparin therapy IF NOT ALREADY DRAWN.    Signed and Held   Signed and Held  Basic metabolic panel  Tomorrow morning,   R     Signed and Held   Signed and Held  CBC  Tomorrow morning,   R     Signed and Held      Vitals/Pain Today's Vitals   04/08/17 1335 04/08/17 1541  BP: (!) 165/67 (!) 154/65  Pulse: 85 87  Resp: 19 16  Temp: 98.2 F (36.8 C)   TempSrc: Oral   SpO2: 97% (!) 65%  Weight: 155 lb (70.3 kg)   Height: '5\' 8"'$  (1.727 m)   PainSc:  3     Isolation Precautions No active isolations  Medications Medications  fentaNYL (SUBLIMAZE) injection 25 mcg (25 mcg Intravenous Given 04/08/17 1537)  potassium chloride SA (K-DUR,KLOR-CON) CR tablet 40 mEq (40 mEq Oral Given 04/08/17 1656)  acetaminophen (TYLENOL) tablet 1,000 mg (1,000 mg Oral Given 04/08/17 1656)  traMADol (ULTRAM) tablet 50 mg (50 mg Oral Given 04/08/17  1656)    Mobility non-ambulatory

## 2017-04-08 NOTE — ED Provider Notes (Signed)
[facility] DEPT Provider Note   CSN: [id number] Arrival date & time: 04/08/17  1324     History   Chief Complaint Chief Complaint  Patient presents with  . Fall  . Hip Pain    HPI Brittany Solomon is a 84 y.o. female.  HPI   84 year old female with past medical of hypertension, hyperlipidemia, rheumatoid arthritis, here with fall.  The patient has dementia and is unable to provide history.  However, she reportedly was last seen normal last night.  Her son came to visit her this morning at around noon and she was down on the ground.  She was sitting next to her chair, with a blanket over her.  There were signs that she had fallen.  Family tried to stand her up and she complained of severe left pelvis pain and was subsequently brought to the ED.  Patient unable to provide much history.  She reportedly was in her usual state of health yesterday.  Level 5 caveat invoked as remainder of history, ROS, and physical exam limited due to patient's dementia.   Past Medical History:  Diagnosis Date  . Anemia   . Benign neoplasm of colon   . Depression   . Diverticulitis   . Hearing impairment   . Hypercholesteremia   . Hypertension   . Memory loss   . Osteoporosis   . Rheumatoid arthritis ([place])   . Shingles   . Spinal stenosis     Patient Active Problem List   Diagnosis Date Noted  . History of total replacement of right ankle 04/17/2016  . Primary osteoarthritis of both hands 04/10/2016  . Primary osteoarthritis of both knees 04/10/2016  . Primary osteoarthritis of both feet 04/10/2016  . Spondylosis of lumbar region without myelopathy or radiculopathy 04/10/2016  . Age-related osteoporosis without current pathological fracture 04/10/2016  . High risk medication use 04/10/2016  . History of glaucoma 04/10/2016  . History of dementia 04/10/2016  . History of anemia 04/10/2016  . History of hyperlipidemia 04/10/2016  . Primary osteoarthritis of  right ankle 04/10/2016  . Rheumatoid arthritis of multiple sites without organ or system involvement with positive rheumatoid factor ([place]) 10/25/2015    Past Surgical History:  Procedure Laterality Date  . ANKLE SURGERY    . BACK SURGERY    . GALLBLADDER SURGERY    . TONSILLECTOMY AND ADENOIDECTOMY    . VAGINAL HYSTERECTOMY      OB History    No data available       Home Medications    Prior to Admission medications   Medication Sig Start Date End Date Taking? Authorizing Provider  aspirin EC 81 MG tablet Take 81 mg by mouth daily.   Yes [provider]  calcium carbonate (OS-CAL) 600 MG TABS tablet Take 600 mg by mouth 2 (two) times daily with a meal.   Yes [provider]  celecoxib (CELEBREX) 100 MG capsule Take 1 capsule (100 mg total) by mouth daily as needed. Patient taking differently: Take 100 mg by mouth every Monday, Wednesday, and Friday. Take 100mg  by mouth ONLY on MWF 09/12/16  Yes Deveshwar, Abel Presto, MD  Cholecalciferol (VITAMIN D3) 400 UNITS CAPS Take 1 capsule by mouth daily.   Yes [provider]  donepezil (ARICEPT) 10 MG tablet Take 1 tablet (10 mg total) by mouth at bedtime. 10/14/15  Yes Penumalli, Earlean Polka, MD  ferrous sulfate 325 (65 FE) MG tablet Take 325 mg by mouth 2 (two) times daily  with a meal.    Yes [provider]  folic acid (FOLVITE) 1 MG tablet Take 1 mg by mouth daily.   Yes [provider]  hydrochlorothiazide (HYDRODIURIL) 25 MG tablet Take 25 mg by mouth daily.   Yes [provider]  memantine (NAMENDA) 10 MG tablet Take 1 tablet (10 mg total) by mouth 2 (two) times daily. 11/01/15  Yes Penumalli, Earlean Polka, MD  methotrexate (RHEUMATREX) 2.5 MG tablet TAKE 4 TABLETS BY MOUTH  ONCE EVERY WEEK 03/19/17  Yes Deveshwar, Abel Presto, MD  Multiple Vitamins-Minerals (PRESERVISION AREDS 2) CAPS Take by mouth.   Yes [provider]  omeprazole (PRILOSEC) 20 MG capsule Take 20 mg by mouth daily.    Yes  [provider]  PARoxetine (PAXIL) 20 MG tablet Take 1 tablet by mouth daily. 04/13/13  Yes [provider]  potassium chloride SA (K-DUR,KLOR-CON) 20 MEQ tablet Take 1 tablet by mouth 2 (two) times daily.  04/01/13  Yes [provider]  pravastatin (PRAVACHOL) 40 MG tablet Take 1 tablet by mouth daily. 03/20/13  Yes [provider]  vitamin E (VITAMIN E) 400 UNIT capsule Take 400 Units by mouth daily.   Yes [provider]  Golimumab ([place] ARIA IV) Inject into the vein.    [provider]    Family History Family History  Problem Relation Age of Onset  . Parkinsonism Mother   . Heart attack Father     Social History Social History   Tobacco Use  . Smoking status: Never Smoker  . Smokeless tobacco: Never Used  Substance Use Topics  . Alcohol use: No  . Drug use: No     Allergies   Amoxicillin   Review of Systems Review of Systems  Unable to perform ROS: Dementia     Physical Exam Updated Vital Signs BP (!) 154/65 (BP Location: Left Arm)   Pulse 87   Temp 98.2 F (36.8 C) (Oral)   Resp 16   Ht 5\' 8"  (1.727 m)   Wt 70.3 kg (155 lb)   SpO2 (!) 65%   BMI 23.57 kg/m   Physical Exam  Constitutional: She is oriented to person, place, and time. She appears well-developed and well-nourished. No distress.  HENT:  Head: Normocephalic and atraumatic.  Eyes: Conjunctivae are normal.  Neck: Neck supple.  Cardiovascular: Normal rate, regular rhythm and normal heart sounds. Exam reveals no friction rub.  No murmur heard. Pulmonary/Chest: Effort normal and breath sounds normal. No respiratory distress. She has no wheezes. She has no rales.  Abdominal: She exhibits no distension.  Musculoskeletal: She exhibits no edema.  Significant tenderness to palpation over left anterior pelvis, iliac crest, and mild tenderness over the greater trochanter.  No lower extremity shortening or malrotation.  Neurological: She is alert  and oriented to person, place, and time. She exhibits normal muscle tone.  Skin: Skin is warm. Capillary refill takes less than 2 seconds.  Psychiatric: She has a normal mood and affect.  Nursing note and vitals reviewed.    ED Treatments / Results  Labs (all labs ordered are listed, but only abnormal results are displayed) Labs Reviewed  CBC WITH DIFFERENTIAL/PLATELET - Abnormal; Notable for the following components:      Result Value   WBC 13.4 (*)    Platelets 148 (*)    Neutro Abs 11.7 (*)    All other components within normal limits  COMPREHENSIVE METABOLIC PANEL - Abnormal; Notable for the following components:   Sodium  134 (*)    Potassium 3.3 (*)    Chloride 96 (*)    Glucose, Bld 111 (*)    Total Protein 6.1 (*)    Total Bilirubin 1.3 (*)    All other components within normal limits  CK  URINALYSIS, ROUTINE W REFLEX MICROSCOPIC  I-STAT TROPONIN, ED    EKG  EKG Interpretation  Date/Time:  Sunday April 08 2017 14:29:22 EST Ventricular Rate:  90 PR Interval:    QRS Duration: 117 QT Interval:  376 QTC Calculation: 461 R Axis:   61 Text Interpretation:  Sinus rhythm Probable left atrial enlargement Nonspecific intraventricular conduction delay Low voltage, extremity leads No significant change since last tracing Confirmed by Duffy Bruce [phone]) on 04/08/2017 3:26:07 PM       Radiology Ct Head Wo Contrast  Result Date: 04/08/2017 CLINICAL DATA:  Fall at home while walking to bathroom. Left hip pain. EXAM: CT HEAD WITHOUT CONTRAST CT CERVICAL SPINE WITHOUT CONTRAST TECHNIQUE: Multidetector CT imaging of the head and cervical spine was performed following the standard protocol without intravenous contrast. Multiplanar CT image reconstructions of the cervical spine were also generated. COMPARISON:  None. FINDINGS: CT HEAD FINDINGS Brain: Ventricles, cisterns and other CSF spaces are within normal as there is minimal age related atrophic change. There is moderate  chronic ischemic microvascular disease. There is no mass, mass effect, shift of midline structures or acute hemorrhage. No evidence of acute infarction. Minimal basal ganglia calcifications. Vascular: No hyperdense vessel or unexpected calcification. Skull: Normal. Negative for fracture or focal lesion. Sinuses/Orbits: No acute finding. Other: None. CT CERVICAL SPINE FINDINGS Alignment: Very subtle 1-2 mm anterior subluxation of C3 on C4 likely degenerative. Skull base and vertebrae: Vertebral body heights are normal. There is mild to moderate spondylosis throughout the cervical spine. Atlantoaxial articulation is within normal. There is uncovertebral joint spurring and facet arthropathy. No acute fracture. Severe bilateral neural foraminal narrowing at multiple levels due to adjacent bony spurring. Soft tissues and spinal canal: No prevertebral fluid or swelling. No visible canal hematoma. Disc levels: Moderate disc space narrowing at the C4-5, C5-6 and C6-7 levels. Upper chest: Negative. Other: None. IMPRESSION: No acute brain injury. Moderate chronic ischemic microvascular disease and mild age related atrophic change. No acute cervical spine injury. Mild to moderate spondylosis of the cervical spine with multilevel disc disease and significant bilateral multilevel neural foraminal narrowing due to adjacent bony spurring. Electronically Signed   By: Marin Olp M.D.   On: 04/08/2017 14:55   Ct Cervical Spine Wo Contrast  Result Date: 04/08/2017 CLINICAL DATA:  Fall at home while walking to bathroom. Left hip pain. EXAM: CT HEAD WITHOUT CONTRAST CT CERVICAL SPINE WITHOUT CONTRAST TECHNIQUE: Multidetector CT imaging of the head and cervical spine was performed following the standard protocol without intravenous contrast. Multiplanar CT image reconstructions of the cervical spine were also generated. COMPARISON:  None. FINDINGS: CT HEAD FINDINGS Brain: Ventricles, cisterns and other CSF spaces are within normal  as there is minimal age related atrophic change. There is moderate chronic ischemic microvascular disease. There is no mass, mass effect, shift of midline structures or acute hemorrhage. No evidence of acute infarction. Minimal basal ganglia calcifications. Vascular: No hyperdense vessel or unexpected calcification. Skull: Normal. Negative for fracture or focal lesion. Sinuses/Orbits: No acute finding. Other: None. CT CERVICAL SPINE FINDINGS Alignment: Very subtle 1-2 mm anterior subluxation of C3 on C4 likely degenerative. Skull base and vertebrae: Vertebral body heights are normal. There is mild to moderate spondylosis  throughout the cervical spine. Atlantoaxial articulation is within normal. There is uncovertebral joint spurring and facet arthropathy. No acute fracture. Severe bilateral neural foraminal narrowing at multiple levels due to adjacent bony spurring. Soft tissues and spinal canal: No prevertebral fluid or swelling. No visible canal hematoma. Disc levels: Moderate disc space narrowing at the C4-5, C5-6 and C6-7 levels. Upper chest: Negative. Other: None. IMPRESSION: No acute brain injury. Moderate chronic ischemic microvascular disease and mild age related atrophic change. No acute cervical spine injury. Mild to moderate spondylosis of the cervical spine with multilevel disc disease and significant bilateral multilevel neural foraminal narrowing due to adjacent bony spurring. Electronically Signed   By: Marin Olp M.D.   On: 04/08/2017 14:55   Ct Pelvis Wo Contrast  Result Date: 04/08/2017 CLINICAL DATA:  Left hip pain after fall. Pubic rami fractures seen on x-ray. EXAM: CT PELVIS WITHOUT CONTRAST TECHNIQUE: Multidetector CT imaging of the pelvis was performed following the standard protocol without intravenous contrast. COMPARISON:  Left hip x-rays from same day. FINDINGS: Urinary Tract:  No abnormality visualized. Bowel: Colonic diverticulosis. Otherwise unremarkable visualized pelvic bowel  loops. Vascular/Lymphatic: Mild aortic atherosclerosis. No lymphadenopathy. Reproductive:  Prior hysterectomy.  No adnexal mass. Other:  None. Musculoskeletal: Acute, transverse, nondisplaced fracture of the left puboacetabular junction. Acute, vertical, nondisplaced fracture through the left inferior pubic ramus. No additional fracture seen. Prior posterior decompression at L4-L5 and L5-S1. Severe lower lumbar degenerative disc disease and facet arthropathy. Osteopenia. IMPRESSION: 1. Acute, nondisplaced fractures of the left puboacetabular junction and left inferior pubic ramus. 2.  Aortic atherosclerosis (ICD10-I70.0). Electronically Signed   By: Titus Dubin M.D.   On: 04/08/2017 15:02   Dg Hip Unilat With Pelvis 2-3 Views Left  Result Date: 04/08/2017 CLINICAL DATA:  Fall.  Left hip pain. EXAM: DG HIP (WITH OR WITHOUT PELVIS) 2-3V LEFT COMPARISON:  DEXA scan November 02, 2014 FINDINGS: Soft tissue calcifications are seen adjacent to the right hip and overlying the right femoral neck, unchanged since the comparison DEXA scan. No acute right hip fracture based on a single frontal view. Fractures are seen in the left pubic bones. There is a fracture through the inferior pubic ramus. There is a no other fracture through the superior ramus near its junction with the acetabulum. It is possible the fracture affects the acetabulum. The lower lumbar spine and sacrum are grossly unremarkable. No fracture seen through the left proximal femur. No dislocation. IMPRESSION: 1. There is a fracture at the base of the left superior ramus which could extend into the acetabulum. CT imaging could better evaluate. Left inferior pubic ramus fracture. No fracture of the proximal left femur. 2. Chronic changes on the right. Electronically Signed   By: Dorise Bullion III M.D   On: 04/08/2017 14:00    Procedures Procedures (including critical care time)  Medications Ordered in ED Medications  fentaNYL (SUBLIMAZE)  injection 25 mcg (25 mcg Intravenous Given 04/08/17 1537)  potassium chloride SA (K-DUR,KLOR-CON) CR tablet 40 mEq (not administered)  acetaminophen (TYLENOL) tablet 1,000 mg (not administered)  traMADol (ULTRAM) tablet 50 mg (not administered)     Initial Impression / Assessment and Plan / ED Course  I have reviewed the triage vital signs and the nursing notes.  Pertinent labs & imaging results that were available during my care of the patient were reviewed by me and considered in my medical decision making (see chart for details).     84 year old female with past medical history as above here with  suspected fall at home.  She has significant tenderness over her left pelvis.  Plain films show multiple pelvic fractures so CT scan obtained.  CT scan shows nondisplaced fracture of left pubo-acetabular junction and left inferior pubic ramus.  Patient will be admitted for pain control.  Discussed with orthopedics, injury is nonoperative.  Weightbearing as tolerated with physical therapy consult.  Lab work pending at this time.  Discussed with Dr. Sharol Given.  Orthopedics will see tomorrow.  Lab work overall reassuring.  She has mild leukocytosis.  CMP with mild hypokalemia but no AK I.  Plan to admit.  Urinalysis will need to be followed up.  Final Clinical Impressions(s) / ED Diagnoses   Final diagnoses:  Multiple closed fractures of pelvis without disruption of pelvic ring, initial encounter [facility])    ED Discharge Orders    None       Duffy Bruce, MD 04/08/17 1621

## 2017-04-08 NOTE — H&P (Signed)
History and Physical    Brittany Solomon OFB:[id number] DOB: Jan 09, 1926 DOA: 04/08/2017  PCP: Cyndi Bender, PA-C  Patient coming from: home  Chief Complaint: pain on standing  HPI: Brittany Solomon is a 84 y.o. female with medical history significant of dementia, rheumatoid arthritis, and hyperlipidemia as well as hypertension. Please see past medical history below. Patient is presenting after a fall which she has dementia and is poor historian and unable to provide history. Family was not around and it was unwitnessed as such unable to obtain pertinent history. History is obtained from ER physician and chart as well as family at bedside. The patient does not recall falling or tripping. Patient has pain upon standing.  ED Course: Patient was found to have pubic ramus fracture upon evaluation  Review of Systems: Unable to obtain secondary to dementia   Past Medical History:  Diagnosis Date  . Anemia   . Benign neoplasm of colon   . Depression   . Diverticulitis   . Hearing impairment   . Hypercholesteremia   . Hypertension   . Memory loss   . Osteoporosis   . Rheumatoid arthritis ([place])   . Shingles   . Spinal stenosis     Past Surgical History:  Procedure Laterality Date  . ANKLE SURGERY    . BACK SURGERY    . GALLBLADDER SURGERY    . TONSILLECTOMY AND ADENOIDECTOMY    . VAGINAL HYSTERECTOMY       reports that  has never smoked. she has never used smokeless tobacco. She reports that she does not drink alcohol or use drugs.  Allergies  Allergen Reactions  . Amoxicillin Rash    Family History  Problem Relation Age of Onset  . Parkinsonism Mother   . Heart attack Father     Prior to Admission medications   Medication Sig Start Date End Date Taking? Authorizing Provider  aspirin EC 81 MG tablet Take 81 mg by mouth daily.   Yes [provider]  calcium carbonate (OS-CAL) 600 MG TABS tablet Take 600 mg by mouth 2 (two) times daily with a meal.   Yes [provider]  celecoxib (CELEBREX) 100 MG capsule Take 1 capsule (100 mg total) by mouth daily as needed. Patient taking differently: Take 100 mg by mouth every Monday, Wednesday, and Friday. Take 100mg  by mouth ONLY on MWF 09/12/16  Yes Deveshwar, Abel Presto, MD  Cholecalciferol (VITAMIN D3) 400 UNITS CAPS Take 1 capsule by mouth daily.   Yes [provider]  donepezil (ARICEPT) 10 MG tablet Take 1 tablet (10 mg total) by mouth at bedtime. 10/14/15  Yes Penumalli, Earlean Polka, MD  ferrous sulfate 325 (65 FE) MG tablet Take 325 mg by mouth 2 (two) times daily with a meal.    Yes [provider]  folic acid (FOLVITE) 1 MG tablet Take 1 mg by mouth daily.   Yes [provider]  hydrochlorothiazide (HYDRODIURIL) 25 MG tablet Take 25 mg by mouth daily.   Yes [provider]  memantine (NAMENDA) 10 MG tablet Take 1 tablet (10 mg total) by mouth 2 (two) times daily. 11/01/15  Yes Penumalli, Earlean Polka, MD  methotrexate (RHEUMATREX) 2.5 MG tablet TAKE 4 TABLETS BY MOUTH  ONCE EVERY WEEK 03/19/17  Yes Deveshwar, Abel Presto, MD  Multiple Vitamins-Minerals (PRESERVISION AREDS 2) CAPS Take by mouth.   Yes [provider]  omeprazole (PRILOSEC) 20 MG capsule Take 20 mg by mouth daily.    Yes [provider]  PARoxetine (PAXIL) 20 MG tablet Take 1 tablet by mouth daily. 04/13/13  Yes [provider]  potassium chloride SA (K-DUR,KLOR-CON) 20 MEQ tablet Take 1 tablet by mouth 2 (two) times daily.  04/01/13  Yes [provider]  pravastatin (PRAVACHOL) 40 MG tablet Take 1 tablet by mouth daily. 03/20/13  Yes [provider]  vitamin E (VITAMIN E) 400 UNIT capsule Take 400 Units by mouth daily.   Yes [provider]  Golimumab ([place] ARIA IV) Inject into the vein.    [provider]    Physical Exam: Vitals:   04/08/17 1335 04/08/17 1541  BP: (!) 165/67 (!) 154/65  Pulse: 85 87  Resp: 19 16  Temp: 98.2 F (36.8 C)     TempSrc: Oral   SpO2: 97% (!) 65%  Weight: 70.3 kg (155 lb)   Height: 5\' 8"  (1.727 m)     Constitutional: NAD, calm, comfortable Vitals:   04/08/17 1335 04/08/17 1541  BP: (!) 165/67 (!) 154/65  Pulse: 85 87  Resp: 19 16  Temp: 98.2 F (36.8 C)   TempSrc: Oral   SpO2: 97% (!) 65%  Weight: 70.3 kg (155 lb)   Height: 5\' 8"  (1.727 m)    Eyes: PERRL, lids and conjunctivae normal ENMT: Mucous membranes are moist. Posterior pharynx clear of any exudate or lesions.Normal dentition.  Neck: normal, supple, no masses, no thyromegaly Respiratory: clear to auscultation bilaterally, no wheezing, no crackles. Normal respiratory effort. No accessory muscle use.  Cardiovascular: Regular rate and rhythm, no murmurs / rubs / gallops. No extremity edema.  Abdomen: no tenderness, no masses palpated. No hepatosplenomegaly. Bowel sounds positive.  Musculoskeletal: no clubbing / cyanosis.  Skin: no rashes, lesions, ulcers. No induration on limited exam. Neurologic: Sensation to light touch in tact, no facial asymmetry, moves extremities equally Psychiatric: difficult to exam due to dementia. Mood and affect appopriate   Labs on Admission: I have personally reviewed following labs and imaging studies  CBC: Recent Labs  Lab 04/08/17 1519  WBC 13.4*  NEUTROABS 11.7*  HGB 14.0  HCT 39.2  MCV 94.0  PLT 034*   Basic Metabolic Panel: Recent Labs  Lab 04/08/17 1519  NA 134*  K 3.3*  CL 96*  CO2 28  GLUCOSE 111*  BUN 10  CREATININE 0.60  CALCIUM 9.3   GFR: Estimated Creatinine Clearance: 48.1 mL/min (by C-G formula based on SCr of 0.6 mg/dL). Liver Function Tests: Recent Labs  Lab 04/08/17 1519  AST 33  ALT 18  ALKPHOS 53  BILITOT 1.3*  PROT 6.1*  ALBUMIN 4.1   No results for input(s): LIPASE, AMYLASE in the last 168 hours. No results for input(s): AMMONIA in the last 168 hours. Coagulation Profile: No results for input(s): INR, PROTIME in the last 168 hours. Cardiac  Enzymes: Recent Labs  Lab 04/08/17 1519  CKTOTAL 198   BNP (last 3 results) No results for input(s): PROBNP in the last 8760 hours. HbA1C: No results for input(s): HGBA1C in the last 72 hours. CBG: No results for input(s): GLUCAP in the last 168 hours. Lipid Profile: No results for input(s): CHOL, HDL, LDLCALC, TRIG, CHOLHDL, LDLDIRECT in the last 72 hours. Thyroid Function Tests: No results for input(s): TSH, T4TOTAL, FREET4, T3FREE, THYROIDAB in the last 72 hours. Anemia Panel: No results for input(s): VITAMINB12, FOLATE, FERRITIN, TIBC, IRON, RETICCTPCT in the last 72 hours. Urine analysis: No results found for: COLORURINE, [place]ARANCEUR, [place], [place], [place], [place], [place], [place], [place], [place], [place], [place]  Exams on Admission: Ct Head Wo Contrast  Result Date: 04/08/2017 CLINICAL DATA:  Fall at home while walking to bathroom. Left hip pain. EXAM: CT HEAD WITHOUT CONTRAST CT CERVICAL SPINE WITHOUT CONTRAST TECHNIQUE: Multidetector CT imaging of the head and cervical spine was performed following the standard protocol without intravenous contrast. Multiplanar CT image reconstructions of the cervical spine were also generated. COMPARISON:  None. FINDINGS: CT HEAD FINDINGS Brain: Ventricles, cisterns and other CSF spaces are within normal as there is minimal age related atrophic change. There is moderate chronic ischemic microvascular disease. There is no mass, mass effect, shift of midline structures or acute hemorrhage. No evidence of acute infarction. Minimal basal ganglia calcifications. Vascular: No hyperdense vessel or unexpected calcification. Skull: Normal. Negative for fracture or focal lesion. Sinuses/Orbits: No acute finding. Other: None. CT CERVICAL SPINE FINDINGS Alignment: Very subtle 1-2 mm anterior subluxation of C3 on C4 likely degenerative. Skull base and vertebrae: Vertebral body heights are normal. There is mild to moderate  spondylosis throughout the cervical spine. Atlantoaxial articulation is within normal. There is uncovertebral joint spurring and facet arthropathy. No acute fracture. Severe bilateral neural foraminal narrowing at multiple levels due to adjacent bony spurring. Soft tissues and spinal canal: No prevertebral fluid or swelling. No visible canal hematoma. Disc levels: Moderate disc space narrowing at the C4-5, C5-6 and C6-7 levels. Upper chest: Negative. Other: None. IMPRESSION: No acute brain injury. Moderate chronic ischemic microvascular disease and mild age related atrophic change. No acute cervical spine injury. Mild to moderate spondylosis of the cervical spine with multilevel disc disease and significant bilateral multilevel neural foraminal narrowing due to adjacent bony spurring. Electronically Signed   By: Marin Olp M.D.   On: 04/08/2017 14:55   Ct Cervical Spine Wo Contrast  Result Date: 04/08/2017 CLINICAL DATA:  Fall at home while walking to bathroom. Left hip pain. EXAM: CT HEAD WITHOUT CONTRAST CT CERVICAL SPINE WITHOUT CONTRAST TECHNIQUE: Multidetector CT imaging of the head and cervical spine was performed following the standard protocol without intravenous contrast. Multiplanar CT image reconstructions of the cervical spine were also generated. COMPARISON:  None. FINDINGS: CT HEAD FINDINGS Brain: Ventricles, cisterns and other CSF spaces are within normal as there is minimal age related atrophic change. There is moderate chronic ischemic microvascular disease. There is no mass, mass effect, shift of midline structures or acute hemorrhage. No evidence of acute infarction. Minimal basal ganglia calcifications. Vascular: No hyperdense vessel or unexpected calcification. Skull: Normal. Negative for fracture or focal lesion. Sinuses/Orbits: No acute finding. Other: None. CT CERVICAL SPINE FINDINGS Alignment: Very subtle 1-2 mm anterior subluxation of C3 on C4 likely degenerative. Skull base and  vertebrae: Vertebral body heights are normal. There is mild to moderate spondylosis throughout the cervical spine. Atlantoaxial articulation is within normal. There is uncovertebral joint spurring and facet arthropathy. No acute fracture. Severe bilateral neural foraminal narrowing at multiple levels due to adjacent bony spurring. Soft tissues and spinal canal: No prevertebral fluid or swelling. No visible canal hematoma. Disc levels: Moderate disc space narrowing at the C4-5, C5-6 and C6-7 levels. Upper chest: Negative. Other: None. IMPRESSION: No acute brain injury. Moderate chronic ischemic microvascular disease and mild age related atrophic change. No acute cervical spine injury. Mild to moderate spondylosis of the cervical spine with multilevel disc disease and significant bilateral multilevel neural foraminal narrowing due to adjacent bony spurring. Electronically Signed   By: Marin Olp M.D.   On: 04/08/2017 14:55   Ct Pelvis Wo Contrast  Result  Date: 04/08/2017 CLINICAL DATA:  Left hip pain after fall. Pubic rami fractures seen on x-ray. EXAM: CT PELVIS WITHOUT CONTRAST TECHNIQUE: Multidetector CT imaging of the pelvis was performed following the standard protocol without intravenous contrast. COMPARISON:  Left hip x-rays from same day. FINDINGS: Urinary Tract:  No abnormality visualized. Bowel: Colonic diverticulosis. Otherwise unremarkable visualized pelvic bowel loops. Vascular/Lymphatic: Mild aortic atherosclerosis. No lymphadenopathy. Reproductive:  Prior hysterectomy.  No adnexal mass. Other:  None. Musculoskeletal: Acute, transverse, nondisplaced fracture of the left puboacetabular junction. Acute, vertical, nondisplaced fracture through the left inferior pubic ramus. No additional fracture seen. Prior posterior decompression at L4-L5 and L5-S1. Severe lower lumbar degenerative disc disease and facet arthropathy. Osteopenia. IMPRESSION: 1. Acute, nondisplaced fractures of the left  puboacetabular junction and left inferior pubic ramus. 2.  Aortic atherosclerosis (ICD10-I70.0). Electronically Signed   By: Titus Dubin M.D.   On: 04/08/2017 15:02   Dg Hip Unilat With Pelvis 2-3 Views Left  Result Date: 04/08/2017 CLINICAL DATA:  Fall.  Left hip pain. EXAM: DG HIP (WITH OR WITHOUT PELVIS) 2-3V LEFT COMPARISON:  DEXA scan November 02, 2014 FINDINGS: Soft tissue calcifications are seen adjacent to the right hip and overlying the right femoral neck, unchanged since the comparison DEXA scan. No acute right hip fracture based on a single frontal view. Fractures are seen in the left pubic bones. There is a fracture through the inferior pubic ramus. There is a no other fracture through the superior ramus near its junction with the acetabulum. It is possible the fracture affects the acetabulum. The lower lumbar spine and sacrum are grossly unremarkable. No fracture seen through the left proximal femur. No dislocation. IMPRESSION: 1. There is a fracture at the base of the left superior ramus which could extend into the acetabulum. CT imaging could better evaluate. Left inferior pubic ramus fracture. No fracture of the proximal left femur. 2. Chronic changes on the right. Electronically Signed   By: Dorise Bullion III M.D   On: 04/08/2017 14:00    EKG: Independently reviewed. Sinus rhythm with no st elevation or depresion  Assessment/Plan Active Problems:   Pubic ramus fracture [facility]) - Per my discussion with ER physician and spoke to orthopedic surgery patient has a nonoperative fracture. They will evaluate patient however -Supportive therapy with pain control - Place physical therapy consult    Rheumatoid arthritis of multiple sites without organ or system involvement with positive rheumatoid factor (HCC) - Pt is on golimumab and methotrexate. She has taken these already as per her home regimen    History of dementia -  Able will continue Namenda    History of hyperlipidemia -  Stable continue statin  DVT prophylaxis:  Code Status: full Family Communication: discussed with family at bedside. Disposition Plan:  Consults called: ER consulted Ortho Admission status: observation   Velvet Bathe MD [facility] Pager [phone]  If 7PM-7AM, please contact night-coverage [email or web address] Password [facility]  04/08/2017, 4:49 PM

## 2017-04-08 NOTE — ED Triage Notes (Addendum)
EMS states pt fell at home on carpet floor after going to bathroom, left hip pain. Unknown what time pt fell.166/74-90-96% RA 18 CBG 133

## 2017-04-09 DIAGNOSIS — S32592A Other specified fracture of left pubis, initial encounter for closed fracture: Principal | ICD-10-CM

## 2017-04-09 LAB — BASIC METABOLIC PANEL
Anion gap: 8 (ref 5–15)
BUN: 18 mg/dL (ref 6–20)
CALCIUM: 9.6 mg/dL (ref 8.9–10.3)
CO2: 28 mmol/L (ref 22–32)
CREATININE: 0.65 mg/dL (ref 0.44–1.00)
Chloride: 100 mmol/L — ABNORMAL LOW (ref 101–111)
GFR calc non Af Amer: >60 mL/min (ref >60)
Glucose, Bld: 96 mg/dL (ref 65–99)
Potassium: 4.1 mmol/L (ref 3.5–5.1)
Sodium: 136 mmol/L (ref 135–145)

## 2017-04-09 LAB — CBC
HCT: 37.7 % (ref 36.0–46.0)
Hemoglobin: 13 g/dL (ref 12.0–15.0)
MCH: 32.7 pg (ref 26.0–34.0)
MCHC: 34.5 g/dL (ref 30.0–36.0)
MCV: 95 fL (ref 78.0–100.0)
PLATELETS: 141 10*3/uL — AB (ref 150–400)
RBC: 3.97 MIL/uL (ref 3.87–5.11)
RDW: 13.7 % (ref 11.5–15.5)
WBC: 7.5 10*3/uL (ref 4.0–10.5)

## 2017-04-09 MED ORDER — OXYCODONE HCL 5 MG PO TABS: 5.0000 mg | ORAL_TABLET | Freq: Four times a day (QID) | ORAL | Status: DC | PRN

## 2017-04-09 MED ORDER — ACETAMINOPHEN 325 MG PO TABS: 650.0000 mg | ORAL_TABLET | Freq: Four times a day (QID) | ORAL | Status: DC | PRN

## 2017-04-09 MED ORDER — TRAMADOL HCL 50 MG PO TABS
50.0000 mg | ORAL_TABLET | Freq: Four times a day (QID) | ORAL | Status: DC | PRN
  Administered 2017-04-11: 09:00:00 50 mg via ORAL
  Filled 2017-04-09: qty 1

## 2017-04-09 MED ORDER — ONDANSETRON HCL 4 MG/2ML IJ SOLN
4.0000 mg | Freq: Four times a day (QID) | INTRAMUSCULAR | Status: DC | PRN
  Administered 2017-04-09 – 2017-04-11 (×2): 4 mg via INTRAVENOUS
  Filled 2017-04-09: qty 2

## 2017-04-09 MED ORDER — ACETAMINOPHEN 325 MG PO TABS
650.0000 mg | ORAL_TABLET | Freq: Four times a day (QID) | ORAL | Status: DC | PRN
  Administered 2017-04-09 – 2017-04-10 (×3): 650 mg via ORAL
  Filled 2017-04-09 (×3): qty 2

## 2017-04-09 MED ORDER — SODIUM CHLORIDE 0.9 % IV SOLN
INTRAVENOUS | Status: DC
  Administered 2017-04-09 – 2017-04-10 (×3): via INTRAVENOUS

## 2017-04-09 MED ORDER — BOOST / RESOURCE BREEZE PO LIQD CUSTOM
1.0000 | Freq: Two times a day (BID) | ORAL | Status: DC
  Administered 2017-04-09 – 2017-04-12 (×5): 1 via ORAL

## 2017-04-09 MED ORDER — ONDANSETRON HCL 4 MG PO TABS
4.0000 mg | ORAL_TABLET | Freq: Four times a day (QID) | ORAL | Status: DC | PRN
  Filled 2017-04-09: qty 1

## 2017-04-09 NOTE — Evaluation (Signed)
Physical Therapy Evaluation Patient Details Name: Brittany Solomon MRN: [id number] DOB: Feb 25, 1925 Today's Date: 04/09/2017   History of Present Illness  Brittany Solomon is a 84 y.o. female who presents with left inferior and superior pubic rami fractures after a fall  on 04/08/17. Per ortho, OK for WBAT. H/O L ankle surgery/fusion  Clinical Impression  The patient  Having difficulty with weight bearing on the left leg with attempts to stand at RW x 2 trials. No family present . RN indicates SNF is planned. Pt admitted with above diagnosis. Pt currently with functional limitations due to the deficits listed below (see PT Problem List).  Pt will benefit from skilled PT to increase their independence and safety with mobility to allow discharge to the venue listed below.       Follow Up Recommendations SNF    Equipment Recommendations  None recommended by PT    Recommendations for Other Services       Precautions / Restrictions Precautions Precautions: Fall      Mobility  Bed Mobility               General bed mobility comments: in recliner  Transfers Overall transfer level: Needs assistance Equipment used: Rolling walker (2 wheeled) Transfers: Sit to/from Stand Sit to Stand: Max assist         General transfer comment: 2 trials to stand at the RW. Barely tolerated standing. RN reports extensive assistance to [facility] earlier.  Ambulation/Gait                Stairs            Wheelchair Mobility    Modified Rankin (Stroke Patients Only)       Balance Overall balance assessment: History of Falls;Needs assistance Sitting-balance support: Feet unsupported;Bilateral upper extremity supported Sitting balance-Leahy Scale: Fair     Standing balance support: During functional activity;Bilateral upper extremity supported Standing balance-Leahy Scale: Poor                               Pertinent Vitals/Pain Pain Assessment: Faces Faces Pain Scale:  Hurts whole lot Pain Location: left pevic area and back Pain Descriptors / Indicators: Discomfort;Grimacing;Guarding;Sharp Pain Intervention(s): Monitored during session;Patient requesting pain meds-RN notified    Home Living Family/patient expects to be discharged to:: Private residence Living Arrangements: Alone Available Help at Discharge: Family Type of Home: House Home Access: White: One level [place]: Environmental consultant - 2 wheels Additional Comments: no family present, RN reports family assists patient    Prior Function Level of Independence: Needs assistance   Gait / Transfers Assistance Needed: pt reports using RW           Hand Dominance        Extremity/Trunk Assessment   Upper Extremity Assessment Upper Extremity Assessment: Generalized weakness    Lower Extremity Assessment Lower Extremity Assessment: Generalized weakness    Cervical / Trunk Assessment Cervical / Trunk Assessment: Kyphotic  Communication      Cognition Arousal/Alertness: Awake/alert Behavior During Therapy: WFL for tasks assessed/performed Overall Cognitive Status: No family/caregiver present to determine baseline cognitive functioning                                 General Comments: patient not oriented to date . Pt. reports living  alone. no family present.  General Comments      Exercises     Assessment/Plan    PT Assessment Patient needs continued PT services  PT Problem List Decreased strength;Decreased range of motion;Decreased cognition;Decreased activity tolerance;Decreased knowledge of use of DME;Decreased balance;Decreased mobility;Decreased knowledge of precautions;Decreased safety awareness;Pain       PT Treatment Interventions DME instruction;Therapeutic exercise;Gait training;Functional mobility training;Therapeutic activities;Patient/family education    PT Goals (Current goals can be found in the Care Plan section)   Acute Rehab PT Goals Patient Stated Goal: to go home PT Goal Formulation: Patient unable to participate in goal setting Time For Goal Achievement: 04/23/17 Potential to Achieve Goals: Fair    Frequency Min 2X/week   Barriers to discharge        Co-evaluation               AM-PAC PT "6 Clicks" Daily Activity  Outcome Measure Difficulty turning over in bed (including adjusting bedclothes, sheets and blankets)?: Unable Difficulty moving from lying on back to sitting on the side of the bed? : Unable Difficulty sitting down on and standing up from a chair with arms (e.g., wheelchair, bedside commode, etc,.)?: Unable Help needed moving to and from a bed to chair (including a wheelchair)?: Total Help needed walking in hospital room?: Total Help needed climbing 3-5 steps with a railing? : Total 6 Click Score: 6    End of Session Equipment Utilized During Treatment: Gait belt Activity Tolerance: Patient limited by pain Patient left: in chair Nurse Communication: Mobility status PT Visit Diagnosis: History of falling (Z91.81)    Time: 5631-4970 PT Time Calculation (min) (ACUTE ONLY): 20 min   Charges:   PT Evaluation $PT Eval Low Complexity: 1 Low     PT G CodesTresa Endo PT [phone]   Claretha Cooper 04/09/2017, 1:25 PM

## 2017-04-09 NOTE — Consult Note (Signed)
ORTHOPAEDIC CONSULTATION  REQUESTING PHYSICIAN: Theodis Blaze, MD  Chief Complaint: Left pelvic pain.  HPI: Brittany Solomon is a 84 y.o. female who presents with left inferior and superior pubic rami fractures.  Initial radiographs were concerning for possible involvement of the acetabulum.  CT scans were obtained.  Past Medical History:  Diagnosis Date  . Anemia   . Benign neoplasm of colon   . Depression   . Diverticulitis   . Hearing impairment   . Hypercholesteremia   . Hypertension   . Memory loss   . Osteoporosis   . Rheumatoid arthritis ([place])   . Shingles   . Spinal stenosis    Past Surgical History:  Procedure Laterality Date  . ANKLE SURGERY    . BACK SURGERY    . GALLBLADDER SURGERY    . TONSILLECTOMY AND ADENOIDECTOMY    . VAGINAL HYSTERECTOMY     Social History   Socioeconomic History  . Marital status: Widowed    Spouse name: None  . Number of children: 1  . Years of education: [employer]  . Highest education level: None  Social Needs  . Financial resource strain: None  . Food insecurity - worry: None  . Food insecurity - inability: None  . Transportation needs - medical: None  . Transportation needs - non-medical: None  Occupational History  . Occupation: Retired  Tobacco Use  . Smoking status: Never Smoker  . Smokeless tobacco: Never Used  Substance and Sexual Activity  . Alcohol use: No  . Drug use: No  . Sexual activity: None  Other Topics Concern  . None  Social History Narrative   Patient lives at home alone.   Caffeine Use: none   Family History  Problem Relation Age of Onset  . Parkinsonism Mother   . Heart attack Father    - negative except otherwise stated in the family history section Allergies  Allergen Reactions  . Amoxicillin Rash   Prior to Admission medications   Medication Sig Start Date End Date Taking? Authorizing Provider  aspirin EC 81 MG tablet Take 81 mg by mouth daily.   Yes [provider]    calcium carbonate (OS-CAL) 600 MG TABS tablet Take 600 mg by mouth 2 (two) times daily with a meal.   Yes [provider]  celecoxib (CELEBREX) 100 MG capsule Take 1 capsule (100 mg total) by mouth daily as needed. Patient taking differently: Take 100 mg by mouth every Monday, Wednesday, and Friday. Take 100mg  by mouth ONLY on MWF 09/12/16  Yes Deveshwar, Abel Presto, MD  Cholecalciferol (VITAMIN D3) 400 UNITS CAPS Take 1 capsule by mouth daily.   Yes [provider]  donepezil (ARICEPT) 10 MG tablet Take 1 tablet (10 mg total) by mouth at bedtime. 10/14/15  Yes Penumalli, Earlean Polka, MD  ferrous sulfate 325 (65 FE) MG tablet Take 325 mg by mouth 2 (two) times daily with a meal.    Yes [provider]  folic acid (FOLVITE) 1 MG tablet Take 1 mg by mouth daily.   Yes [provider]  hydrochlorothiazide (HYDRODIURIL) 25 MG tablet Take 25 mg by mouth daily.   Yes [provider]  memantine (NAMENDA) 10 MG tablet Take 1 tablet (10 mg total) by mouth 2 (two) times daily. 11/01/15  Yes Penumalli, Earlean Polka, MD  methotrexate (RHEUMATREX) 2.5 MG tablet TAKE 4 TABLETS BY MOUTH  ONCE EVERY WEEK 03/19/17  Yes Deveshwar, Abel Presto, MD  Multiple Vitamins-Minerals (PRESERVISION AREDS  2) CAPS Take by mouth.   Yes [provider]  omeprazole (PRILOSEC) 20 MG capsule Take 20 mg by mouth daily.    Yes [provider]  PARoxetine (PAXIL) 20 MG tablet Take 1 tablet by mouth daily. 04/13/13  Yes [provider]  potassium chloride SA (K-DUR,KLOR-CON) 20 MEQ tablet Take 1 tablet by mouth 2 (two) times daily.  04/01/13  Yes [provider]  pravastatin (PRAVACHOL) 40 MG tablet Take 1 tablet by mouth daily. 03/20/13  Yes [provider]  vitamin E (VITAMIN E) 400 UNIT capsule Take 400 Units by mouth daily.   Yes [provider]  Golimumab ([place] ARIA IV) Inject into the vein.    [provider]   Ct Head Wo Contrast  Result  Date: 04/08/2017 CLINICAL DATA:  Fall at home while walking to bathroom. Left hip pain. EXAM: CT HEAD WITHOUT CONTRAST CT CERVICAL SPINE WITHOUT CONTRAST TECHNIQUE: Multidetector CT imaging of the head and cervical spine was performed following the standard protocol without intravenous contrast. Multiplanar CT image reconstructions of the cervical spine were also generated. COMPARISON:  None. FINDINGS: CT HEAD FINDINGS Brain: Ventricles, cisterns and other CSF spaces are within normal as there is minimal age related atrophic change. There is moderate chronic ischemic microvascular disease. There is no mass, mass effect, shift of midline structures or acute hemorrhage. No evidence of acute infarction. Minimal basal ganglia calcifications. Vascular: No hyperdense vessel or unexpected calcification. Skull: Normal. Negative for fracture or focal lesion. Sinuses/Orbits: No acute finding. Other: None. CT CERVICAL SPINE FINDINGS Alignment: Very subtle 1-2 mm anterior subluxation of C3 on C4 likely degenerative. Skull base and vertebrae: Vertebral body heights are normal. There is mild to moderate spondylosis throughout the cervical spine. Atlantoaxial articulation is within normal. There is uncovertebral joint spurring and facet arthropathy. No acute fracture. Severe bilateral neural foraminal narrowing at multiple levels due to adjacent bony spurring. Soft tissues and spinal canal: No prevertebral fluid or swelling. No visible canal hematoma. Disc levels: Moderate disc space narrowing at the C4-5, C5-6 and C6-7 levels. Upper chest: Negative. Other: None. IMPRESSION: No acute brain injury. Moderate chronic ischemic microvascular disease and mild age related atrophic change. No acute cervical spine injury. Mild to moderate spondylosis of the cervical spine with multilevel disc disease and significant bilateral multilevel neural foraminal narrowing due to adjacent bony spurring. Electronically Signed   By: Marin Olp M.D.    On: 04/08/2017 14:55   Ct Cervical Spine Wo Contrast  Result Date: 04/08/2017 CLINICAL DATA:  Fall at home while walking to bathroom. Left hip pain. EXAM: CT HEAD WITHOUT CONTRAST CT CERVICAL SPINE WITHOUT CONTRAST TECHNIQUE: Multidetector CT imaging of the head and cervical spine was performed following the standard protocol without intravenous contrast. Multiplanar CT image reconstructions of the cervical spine were also generated. COMPARISON:  None. FINDINGS: CT HEAD FINDINGS Brain: Ventricles, cisterns and other CSF spaces are within normal as there is minimal age related atrophic change. There is moderate chronic ischemic microvascular disease. There is no mass, mass effect, shift of midline structures or acute hemorrhage. No evidence of acute infarction. Minimal basal ganglia calcifications. Vascular: No hyperdense vessel or unexpected calcification. Skull: Normal. Negative for fracture or focal lesion. Sinuses/Orbits: No acute finding. Other: None. CT CERVICAL SPINE FINDINGS Alignment: Very subtle 1-2 mm anterior subluxation of C3 on C4 likely degenerative. Skull base and vertebrae: Vertebral body heights are normal. There is mild to moderate spondylosis throughout the cervical spine. Atlantoaxial articulation is within  normal. There is uncovertebral joint spurring and facet arthropathy. No acute fracture. Severe bilateral neural foraminal narrowing at multiple levels due to adjacent bony spurring. Soft tissues and spinal canal: No prevertebral fluid or swelling. No visible canal hematoma. Disc levels: Moderate disc space narrowing at the C4-5, C5-6 and C6-7 levels. Upper chest: Negative. Other: None. IMPRESSION: No acute brain injury. Moderate chronic ischemic microvascular disease and mild age related atrophic change. No acute cervical spine injury. Mild to moderate spondylosis of the cervical spine with multilevel disc disease and significant bilateral multilevel neural foraminal narrowing due to  adjacent bony spurring. Electronically Signed   By: Marin Olp M.D.   On: 04/08/2017 14:55   Ct Pelvis Wo Contrast  Result Date: 04/08/2017 CLINICAL DATA:  Left hip pain after fall. Pubic rami fractures seen on x-ray. EXAM: CT PELVIS WITHOUT CONTRAST TECHNIQUE: Multidetector CT imaging of the pelvis was performed following the standard protocol without intravenous contrast. COMPARISON:  Left hip x-rays from same day. FINDINGS: Urinary Tract:  No abnormality visualized. Bowel: Colonic diverticulosis. Otherwise unremarkable visualized pelvic bowel loops. Vascular/Lymphatic: Mild aortic atherosclerosis. No lymphadenopathy. Reproductive:  Prior hysterectomy.  No adnexal mass. Other:  None. Musculoskeletal: Acute, transverse, nondisplaced fracture of the left puboacetabular junction. Acute, vertical, nondisplaced fracture through the left inferior pubic ramus. No additional fracture seen. Prior posterior decompression at L4-L5 and L5-S1. Severe lower lumbar degenerative disc disease and facet arthropathy. Osteopenia. IMPRESSION: 1. Acute, nondisplaced fractures of the left puboacetabular junction and left inferior pubic ramus. 2.  Aortic atherosclerosis (ICD10-I70.0). Electronically Signed   By: Titus Dubin M.D.   On: 04/08/2017 15:02   Dg Hip Unilat With Pelvis 2-3 Views Left  Result Date: 04/08/2017 CLINICAL DATA:  Fall.  Left hip pain. EXAM: DG HIP (WITH OR WITHOUT PELVIS) 2-3V LEFT COMPARISON:  DEXA scan November 02, 2014 FINDINGS: Soft tissue calcifications are seen adjacent to the right hip and overlying the right femoral neck, unchanged since the comparison DEXA scan. No acute right hip fracture based on a single frontal view. Fractures are seen in the left pubic bones. There is a fracture through the inferior pubic ramus. There is a no other fracture through the superior ramus near its junction with the acetabulum. It is possible the fracture affects the acetabulum. The lower lumbar spine and  sacrum are grossly unremarkable. No fracture seen through the left proximal femur. No dislocation. IMPRESSION: 1. There is a fracture at the base of the left superior ramus which could extend into the acetabulum. CT imaging could better evaluate. Left inferior pubic ramus fracture. No fracture of the proximal left femur. 2. Chronic changes on the right. Electronically Signed   By: Dorise Bullion III M.D   On: 04/08/2017 14:00   - pertinent xrays, CT, MRI studies were reviewed and independently interpreted  Positive ROS: All other systems have been reviewed and were otherwise negative with the exception of those mentioned in the HPI and as above.  Physical Exam: General: Alert, no acute distress Psychiatric: Patient is competent for consent with normal mood and affect Lymphatic: No axillary or cervical lymphadenopathy Cardiovascular: No pedal edema Respiratory: No cyanosis, no use of accessory musculature GI: No organomegaly, abdomen is soft and non-tender  Skin: Patient's skin is intact with no abrasions or skin breakdown. Images:  @ENCIMAGES @   Neurologic: Patient does  have protective sensation bilateral lower extremities.   MUSCULOSKELETAL:  Patient's both lower extremities are neurovascular intact she has no rotational malalignment.  Review of the CT  scan does not show involvement of the acetabulum with the inferior and superior pubic rami fractures on the left.  Radiographs were reviewed which shows the rami fractures with minimal displacement.  Assessment: Assessment: Left inferior and superior pubic rami fracture of the pelvis.  Plan: Plan: Patient may progress weightbearing as tolerated without restrictions.  Depending on her mobility she may require discharge to short-term skilled nursing.  I will follow-up in the office in 2 weeks.  Thank you for the consult and the opportunity to see Ms. Domingo Dimes, MD [facility] [phone] 9:33 AM

## 2017-04-09 NOTE — Progress Notes (Signed)
Patient ID: Brittany Solomon, female   DOB: 1925/12/06, 84 y.o.   MRN: [id number]    PROGRESS NOTE    Brittany Solomon  JJO:[id number] DOB: 1926/02/01 DOA: 04/08/2017  PCP: Cyndi Bender, PA-C   Brief Narrative:  Pt is 84 yo female with HTN, dementia, presented after an episode of fall and has sustained left ant and sup pubic rami fractures, ortho consulted.   Assessment & Plan:   Active Problems: Left and and superior Pubic rami fractures (HCC) - per CT scan, no involvement of the acetabulum - continue with conservative management, no role for surgery at this time - provide analgesia as needed - PT eval pending  - appreciate ortho assistance     History of dementia - stable mental status this am    Dizziness and near syncope this AM, hyponatremia  - suspect component of dehydration  - will ask for orthostatic vitals - place on IVF - PT eval     History of hyperlipidemia - continue statin     Leukocytosis  - reactive  - now resolved    Thrombocytopenia - mild, will monitor while inpatient     HTN - reasonable control - continue HCTZ    Hypokalemia  - supplemented and WNL    DVT prophylaxis: Heparin SQ Code Status: Full  Family Communication: Patient at bedside  Disposition Plan: to be determined   Consultants:   Ortho   Procedures:   None  Antimicrobials:   None   Subjective: More nausea this AM.   Objective: Vitals:   04/09/17 0610 04/09/17 0900 04/09/17 0910 04/09/17 1021  BP: (!) 130/54 (!) 120/50 125/62 (!) 112/55  Pulse: 81 (!) 58 86 69  Resp: 16   18  Temp: 98.6 F (37 C)   97.8 F (36.6 C)  TempSrc: Oral   Oral  SpO2: 93%   94%  Weight:      Height:        Intake/Output Summary (Last 24 hours) at 04/09/2017 1141 Last data filed at 04/09/2017 0930 Gross per 24 hour  Intake 390 ml  Output 700 ml  Net -310 ml   Filed Weights   04/08/17 1335  Weight: 70.3 kg (155 lb)    Examination:  General exam: Appears calm and comfortable,  HOH  Respiratory system: Clear to auscultation. Respiratory effort normal. Cardiovascular system: S1 & S2 heard, RRR. No JVD, murmurs, rubs Gastrointestinal system: Abdomen is nondistended, soft and nontender. No organomegaly or masses felt. Normal bowel sounds heard. Central nervous system: Alert and oriented. No focal neurological deficits.  Data Reviewed: I have personally reviewed following labs and imaging studies  CBC: Recent Labs  Lab 04/08/17 1519 04/09/17 0552  WBC 13.4* 7.5  NEUTROABS 11.7*  --   HGB 14.0 13.0  HCT 39.2 37.7  MCV 94.0 95.0  PLT 148* 160*   Basic Metabolic Panel: Recent Labs  Lab 04/08/17 1519 04/09/17 0552  NA 134* 136  K 3.3* 4.1  CL 96* 100*  CO2 28 28  GLUCOSE 111* 96  BUN 10 18  CREATININE 0.60 0.65  CALCIUM 9.3 9.6   Liver Function Tests: Recent Labs  Lab 04/08/17 1519  AST 33  ALT 18  ALKPHOS 53  BILITOT 1.3*  PROT 6.1*  ALBUMIN 4.1   Cardiac Enzymes: Recent Labs  Lab 04/08/17 1519  CKTOTAL 198   Urine analysis:    Component Value Date/Time   COLORURINE YELLOW 04/08/2017 [place] 04/08/2017 1415  LABSPEC 1.011 04/08/2017 1415   PHURINE 7.0 04/08/2017 1415   GLUCOSEU NEGATIVE 04/08/2017 1415   HGBUR NEGATIVE 04/08/2017 1415   BILIRUBINUR NEGATIVE 04/08/2017 1415   KETONESUR 20 (A) 04/08/2017 1415   PROTEINUR NEGATIVE 04/08/2017 1415   NITRITE NEGATIVE 04/08/2017 [place] 04/08/2017 1415   Radiology Studies: Ct Head Wo Contrast  Result Date: 04/08/2017 CLINICAL DATA:  Fall at home while walking to bathroom. Left hip pain. EXAM: CT HEAD WITHOUT CONTRAST CT CERVICAL SPINE WITHOUT CONTRAST TECHNIQUE: Multidetector CT imaging of the head and cervical spine was performed following the standard protocol without intravenous contrast. Multiplanar CT image reconstructions of the cervical spine were also generated. COMPARISON:  None. FINDINGS: CT HEAD FINDINGS Brain: Ventricles, cisterns  and other CSF spaces are within normal as there is minimal age related atrophic change. There is moderate chronic ischemic microvascular disease. There is no mass, mass effect, shift of midline structures or acute hemorrhage. No evidence of acute infarction. Minimal basal ganglia calcifications. Vascular: No hyperdense vessel or unexpected calcification. Skull: Normal. Negative for fracture or focal lesion. Sinuses/Orbits: No acute finding. Other: None. CT CERVICAL SPINE FINDINGS Alignment: Very subtle 1-2 mm anterior subluxation of C3 on C4 likely degenerative. Skull base and vertebrae: Vertebral body heights are normal. There is mild to moderate spondylosis throughout the cervical spine. Atlantoaxial articulation is within normal. There is uncovertebral joint spurring and facet arthropathy. No acute fracture. Severe bilateral neural foraminal narrowing at multiple levels due to adjacent bony spurring. Soft tissues and spinal canal: No prevertebral fluid or swelling. No visible canal hematoma. Disc levels: Moderate disc space narrowing at the C4-5, C5-6 and C6-7 levels. Upper chest: Negative. Other: None. IMPRESSION: No acute brain injury. Moderate chronic ischemic microvascular disease and mild age related atrophic change. No acute cervical spine injury. Mild to moderate spondylosis of the cervical spine with multilevel disc disease and significant bilateral multilevel neural foraminal narrowing due to adjacent bony spurring. Electronically Signed   By: Marin Olp M.D.   On: 04/08/2017 14:55   Ct Cervical Spine Wo Contrast  Result Date: 04/08/2017 CLINICAL DATA:  Fall at home while walking to bathroom. Left hip pain. EXAM: CT HEAD WITHOUT CONTRAST CT CERVICAL SPINE WITHOUT CONTRAST TECHNIQUE: Multidetector CT imaging of the head and cervical spine was performed following the standard protocol without intravenous contrast. Multiplanar CT image reconstructions of the cervical spine were also generated.  COMPARISON:  None. FINDINGS: CT HEAD FINDINGS Brain: Ventricles, cisterns and other CSF spaces are within normal as there is minimal age related atrophic change. There is moderate chronic ischemic microvascular disease. There is no mass, mass effect, shift of midline structures or acute hemorrhage. No evidence of acute infarction. Minimal basal ganglia calcifications. Vascular: No hyperdense vessel or unexpected calcification. Skull: Normal. Negative for fracture or focal lesion. Sinuses/Orbits: No acute finding. Other: None. CT CERVICAL SPINE FINDINGS Alignment: Very subtle 1-2 mm anterior subluxation of C3 on C4 likely degenerative. Skull base and vertebrae: Vertebral body heights are normal. There is mild to moderate spondylosis throughout the cervical spine. Atlantoaxial articulation is within normal. There is uncovertebral joint spurring and facet arthropathy. No acute fracture. Severe bilateral neural foraminal narrowing at multiple levels due to adjacent bony spurring. Soft tissues and spinal canal: No prevertebral fluid or swelling. No visible canal hematoma. Disc levels: Moderate disc space narrowing at the C4-5, C5-6 and C6-7 levels. Upper chest: Negative. Other: None. IMPRESSION: No acute brain injury. Moderate chronic ischemic microvascular disease and mild age  related atrophic change. No acute cervical spine injury. Mild to moderate spondylosis of the cervical spine with multilevel disc disease and significant bilateral multilevel neural foraminal narrowing due to adjacent bony spurring. Electronically Signed   By: Marin Olp M.D.   On: 04/08/2017 14:55   Ct Pelvis Wo Contrast  Result Date: 04/08/2017 CLINICAL DATA:  Left hip pain after fall. Pubic rami fractures seen on x-ray. EXAM: CT PELVIS WITHOUT CONTRAST TECHNIQUE: Multidetector CT imaging of the pelvis was performed following the standard protocol without intravenous contrast. COMPARISON:  Left hip x-rays from same day. FINDINGS: Urinary  Tract:  No abnormality visualized. Bowel: Colonic diverticulosis. Otherwise unremarkable visualized pelvic bowel loops. Vascular/Lymphatic: Mild aortic atherosclerosis. No lymphadenopathy. Reproductive:  Prior hysterectomy.  No adnexal mass. Other:  None. Musculoskeletal: Acute, transverse, nondisplaced fracture of the left puboacetabular junction. Acute, vertical, nondisplaced fracture through the left inferior pubic ramus. No additional fracture seen. Prior posterior decompression at L4-L5 and L5-S1. Severe lower lumbar degenerative disc disease and facet arthropathy. Osteopenia. IMPRESSION: 1. Acute, nondisplaced fractures of the left puboacetabular junction and left inferior pubic ramus. 2.  Aortic atherosclerosis (ICD10-I70.0). Electronically Signed   By: Titus Dubin M.D.   On: 04/08/2017 15:02   Dg Hip Unilat With Pelvis 2-3 Views Left  Result Date: 04/08/2017 CLINICAL DATA:  Fall.  Left hip pain. EXAM: DG HIP (WITH OR WITHOUT PELVIS) 2-3V LEFT COMPARISON:  DEXA scan November 02, 2014 FINDINGS: Soft tissue calcifications are seen adjacent to the right hip and overlying the right femoral neck, unchanged since the comparison DEXA scan. No acute right hip fracture based on a single frontal view. Fractures are seen in the left pubic bones. There is a fracture through the inferior pubic ramus. There is a no other fracture through the superior ramus near its junction with the acetabulum. It is possible the fracture affects the acetabulum. The lower lumbar spine and sacrum are grossly unremarkable. No fracture seen through the left proximal femur. No dislocation. IMPRESSION: 1. There is a fracture at the base of the left superior ramus which could extend into the acetabulum. CT imaging could better evaluate. Left inferior pubic ramus fracture. No fracture of the proximal left femur. 2. Chronic changes on the right. Electronically Signed   By: Dorise Bullion III M.D   On: 04/08/2017 14:00   Scheduled  Meds: . calcium carbonate  1,250 mg Oral BID WC  . cholecalciferol  1,000 Units Oral Daily  . donepezil  10 mg Oral QHS  . ferrous sulfate  325 mg Oral BID WC  . folic acid  1 mg Oral Daily  . heparin  5,000 Units Subcutaneous Q8H  . hydrochlorothiazide  25 mg Oral Daily  . memantine  10 mg Oral BID  . pantoprazole  40 mg Oral Daily  . PARoxetine  20 mg Oral Daily  . pravastatin  40 mg Oral Daily  . sodium chloride flush  3 mL Intravenous Q12H  . vitamin E  400 Units Oral Daily   Continuous Infusions: . sodium chloride    . sodium chloride 75 mL/hr at 04/09/17 1021     LOS: 1 day   Time spent: 25 minutes   Faye Ramsay, MD [facility] Pager [phone]  If 7PM-7AM, please contact night-coverage [email or web address] Password [facility] 04/09/2017, 11:41 AM

## 2017-04-09 NOTE — Plan of Care (Signed)
Reviewed plan of care with patient, specifically safety and importance of using call light for any questions or needs. Pt verbalized understanding of all education.

## 2017-04-09 NOTE — Progress Notes (Signed)
Initial Nutrition Assessment  DOCUMENTATION CODES:   Not applicable  INTERVENTION:   Snacks BID  Boost Breeze po BID, each supplement provides 250 kcal and 9 grams of protein  NUTRITION DIAGNOSIS:   Increased nutrient needs related to other (see comment)(pubic ramus fracture) as evidenced by estimated needs.  GOAL:   Patient will meet greater than or equal to 90% of their needs  MONITOR:   PO intake, Supplement acceptance, Weight trends, I & O's, Skin  REASON FOR ASSESSMENT:   Malnutrition Screening Tool    ASSESSMENT:   Pt with PMH of dementia, HTN, HLD, and rheumatoid arthritis presents after a fall with pubic ramus fracture.   Pt poor historian, spoke with pt's family at bedside.  Pt's family reports pt has a great appetite at baseline, reports she states is not hungry then eats all food placed in front of her. Pt requires assistance, provided by family and an aid 3 times/week, for meal preparation. Per chart, pt consuming between 50-75% of meals at this time.   Pt's family unsure of weight changes. Per chart pt's weight has remained stable over the past year ~150-155 lbs.  Pt refusing Ensure-like supplements because she dislikes milk products. Will order Boost Breeze for pt to try.   Pt's family reports pt vomited this morning but they have given her medicine to aid in nausea management.    Labs and medications reviewed; calcium carbonate, vitamin D, ferrous sulfate, folic acid, Protonix, vitamin E   NUTRITION - FOCUSED PHYSICAL EXAM:    Most Recent Value  Orbital Region  No depletion  Upper Arm Region  No depletion  Thoracic and Lumbar Region  No depletion  Buccal Region  No depletion  Temple Region  Moderate depletion  Clavicle Bone Region  Moderate depletion  Clavicle and Acromion Bone Region  Mild depletion  Scapular Bone Region  Unable to assess  Dorsal Hand  Moderate depletion  Patellar Region  Mild depletion  Anterior Thigh Region  Mild depletion   Posterior Calf Region  Mild depletion  Edema (RD Assessment)  None     Diet Order:  Diet regular Room service appropriate? Yes; Fluid consistency: Thin  EDUCATION NEEDS:   Not appropriate for education at this time  Skin:  Skin Assessment: Reviewed RN Assessment  Last BM:  04/08/17  Height:   Ht Readings from Last 1 Encounters:  04/08/17 5\' 8"  (1.727 m)   Weight:   Wt Readings from Last 1 Encounters:  04/08/17 155 lb (70.3 kg)   Ideal Body Weight:  63.6 kg  BMI:  Body mass index is 23.57 kg/m.  Estimated Nutritional Needs:   Kcal:  1600-1800  Protein:  85-95 grams  Fluid:  >/= 1.6 L/d  [place] Ranger, MS, RDN, LDN 04/09/2017 12:56 PM

## 2017-04-10 LAB — CBC
HCT: 32.2 % — ABNORMAL LOW (ref 36.0–46.0)
HEMOGLOBIN: 11.3 g/dL — AB (ref 12.0–15.0)
MCH: 33.6 pg (ref 26.0–34.0)
MCHC: 35.1 g/dL (ref 30.0–36.0)
MCV: 95.8 fL (ref 78.0–100.0)
Platelets: 120 10*3/uL — ABNORMAL LOW (ref 150–400)
RBC: 3.36 MIL/uL — AB (ref 3.87–5.11)
RDW: 13.9 % (ref 11.5–15.5)
WBC: 7.5 10*3/uL (ref 4.0–10.5)

## 2017-04-10 LAB — BASIC METABOLIC PANEL
ANION GAP: 9 (ref 5–15)
BUN: 18 mg/dL (ref 6–20)
CHLORIDE: 102 mmol/L (ref 101–111)
CO2: 25 mmol/L (ref 22–32)
Calcium: 8.1 mg/dL — ABNORMAL LOW (ref 8.9–10.3)
Creatinine, Ser: 0.63 mg/dL (ref 0.44–1.00)
GFR calc non Af Amer: >60 mL/min (ref >60)
Glucose, Bld: 102 mg/dL — ABNORMAL HIGH (ref 65–99)
POTASSIUM: 3.2 mmol/L — AB (ref 3.5–5.1)
SODIUM: 136 mmol/L (ref 135–145)

## 2017-04-10 MED ORDER — POTASSIUM CHLORIDE CRYS ER 20 MEQ PO TBCR
40.0000 meq | EXTENDED_RELEASE_TABLET | Freq: Once | ORAL | Status: AC
  Administered 2017-04-10: 40 meq via ORAL
  Filled 2017-04-10: qty 2

## 2017-04-10 NOTE — Clinical Social Work Note (Signed)
Clinical Social Work Assessment  Patient Details  Name: Brittany Solomon MRN: [id number] Date of Birth: 1925-11-30  Date of referral:  04/10/17               Reason for consult:  Facility Placement                Permission sought to share information with:  Family Supports Permission granted to share information::  Yes, Verbal Permission Granted  Name::        Agency::  SNF  Relationship::  son  Contact Information:     Housing/Transportation Living arrangements for the past 2 months:  [place] of Information:  Adult Children Patient Interpreter Needed:  None Criminal Activity/Legal Involvement Pertinent to Current Situation/Hospitalization:  No - Comment as needed Significant Relationships:  Adult Children Lives with:  Self Do you feel safe going back to the place where you live?  Yes Need for family participation in patient care:  No (Coment)  Care giving concerns:   Admitted fo pubic ramus fracture.  PT recommends SNF placement.    Social Worker assessment / plan:  Patient has history of dementia. CSW spoke with patient son about discharge planning to SNF facility for short rehab. He reports the patient had fall at home and and has a pubic ramus fracture upon evaluation.  Patient son reports the patient lives at home alone with support of friend and family assisting with medications and meal prep. Patient son states the patient has been to facility in the past and would like with patient to return if space is available.  CSW sent clinical information/ will provide bed offers in the a.m   Plan: SNF  Employment status:  Retired Insurance underwriter information:  Medicare PT Recommendations:  [place] / Referral to community resources:  [place]  Patient/Family's Response to care:  Agreeable and Responding well to care.   Patient/Family's Understanding of and Emotional Response to Diagnosis, Current Treatment, and  Prognosis: Patient family Knowledgeable of patient diagnosis and follow up care.   Emotional Assessment Appearance:  Appears stated age Attitude/Demeanor/Rapport:    Affect (typically observed):  Accepting Orientation:  Oriented to Self, Oriented to Place Alcohol / Substance use:  Not Applicable Psych involvement (Current and /or in the community):  No (Comment)  Discharge Needs  Concerns to be addressed:  Discharge Planning Concerns Readmission within the last 30 days:  No Current discharge risk:  Dependent with Mobility Barriers to Discharge:  Continued Medical Work up   [employer], LCSW 04/10/2017, 5:27 PM

## 2017-04-10 NOTE — NC FL2 (Signed)
[place] LEVEL OF CARE SCREENING TOOL     IDENTIFICATION  Patient Name: Brittany Solomon Birthdate: [birth year] Sex: female Admission Date (Current Location): 04/08/2017  [facility] and [place] Number:  Herbalist and Address:  [facility],  [place], [place]      Provider Number: [id number]  Attending Physician Name and Address:  Theodis Blaze, MD  Relative Name and Phone Number:       Current Level of Care: SNF Recommended Level of Care: [place] Prior Approval Number:    Date Approved/Denied:   PASRR Number:  [id number]   Discharge Plan: SNF    Current Diagnoses: Patient Active Problem List   Diagnosis Date Noted  . Pubic ramus fracture ([place]) 04/08/2017  . History of total replacement of right ankle 04/17/2016  . Primary osteoarthritis of both hands 04/10/2016  . Primary osteoarthritis of both knees 04/10/2016  . Primary osteoarthritis of both feet 04/10/2016  . Spondylosis of lumbar region without myelopathy or radiculopathy 04/10/2016  . Age-related osteoporosis without current pathological fracture 04/10/2016  . High risk medication use 04/10/2016  . History of glaucoma 04/10/2016  . History of dementia 04/10/2016  . History of anemia 04/10/2016  . History of hyperlipidemia 04/10/2016  . Primary osteoarthritis of right ankle 04/10/2016  . Rheumatoid arthritis of multiple sites without organ or system involvement with positive rheumatoid factor ([place]) 10/25/2015    Orientation RESPIRATION BLADDER Height & Weight     Self, Time, Place  Normal Continent, Indwelling catheter Weight: 155 lb (70.3 kg) Height:  5\' 8"  (172.7 cm)  BEHAVIORAL SYMPTOMS/MOOD NEUROLOGICAL BOWEL NUTRITION STATUS      Continent Diet(Regular)  AMBULATORY STATUS COMMUNICATION OF NEEDS Skin   Extensive Assist Verbally Normal                       Personal Care Assistance Level of Assistance  Bathing, Feeding, Dressing  Bathing Assistance: Limited assistance Feeding assistance: Independent Dressing Assistance: Limited assistance     Functional Limitations Info  Sight, Hearing, Speech Sight Info: Impaired Hearing Info: Impaired Speech Info: Adequate    SPECIAL CARE FACTORS FREQUENCY  PT (By licensed PT), OT (By licensed OT)     PT Frequency: 5x/week OT Frequency: 5x/week            Contractures Contractures Info: Not present    Additional Factors Info  Code Status, Allergies, Psychotropic Code Status Info: Fullcode Allergies Info: Allergies: Amoxicillin           Current Medications (04/10/2017):  This is the current hospital active medication list Current Facility-Administered Medications  Medication Dose Route Frequency Provider Last Rate Last Dose  . 0.9 %  sodium chloride infusion  250 mL Intravenous PRN Velvet Bathe, MD      . 0.9 %  sodium chloride infusion   Intravenous Continuous Theodis Blaze, MD 75 mL/hr at 04/10/17 [phone]    . acetaminophen (TYLENOL) tablet 650 mg  650 mg Oral Q6H PRN Theodis Blaze, MD   650 mg at 04/10/17 0942  . calcium carbonate (OS-CAL - dosed in mg of elemental calcium) tablet 1,250 mg  1,250 mg Oral BID WC Velvet Bathe, MD   1,250 mg at 04/10/17 0936  . cholecalciferol (VITAMIN D) tablet 1,000 Units  1,000 Units Oral Daily Velvet Bathe, MD   1,000 Units at 04/10/17 [phone]  . donepezil (ARICEPT) tablet 10 mg  10 mg Oral QHS Velvet Bathe,  MD   10 mg at 04/09/17 2134  . feeding supplement (BOOST / RESOURCE BREEZE) liquid 1 Container  1 Container Oral BID BM Theodis Blaze, MD   1 Container at 04/10/17 [phone]  . ferrous sulfate tablet 325 mg  325 mg Oral BID WC Velvet Bathe, MD   325 mg at 04/10/17 0936  . folic acid (FOLVITE) tablet 1 mg  1 mg Oral Daily Velvet Bathe, MD   1 mg at 04/10/17 0936  . heparin injection 5,000 Units  5,000 Units Subcutaneous Q8H Velvet Bathe, MD   5,000 Units at 04/10/17 0551  . hydrochlorothiazide (HYDRODIURIL) tablet 25 mg   25 mg Oral Daily Velvet Bathe, MD   25 mg at 04/10/17 0936  . memantine (NAMENDA) tablet 10 mg  10 mg Oral BID Velvet Bathe, MD   10 mg at 04/10/17 0936  . ondansetron (ZOFRAN) injection 4 mg  4 mg Intravenous Q6H PRN Theodis Blaze, MD   4 mg at 04/09/17 1214  . ondansetron (ZOFRAN) tablet 4 mg  4 mg Oral Q6H PRN Theodis Blaze, MD      . oxyCODONE (Oxy IR/ROXICODONE) immediate release tablet 5 mg  5 mg Oral Q6H PRN Theodis Blaze, MD      . pantoprazole (PROTONIX) EC tablet 40 mg  40 mg Oral Daily Velvet Bathe, MD   40 mg at 04/10/17 0936  . PARoxetine (PAXIL) tablet 20 mg  20 mg Oral Daily Velvet Bathe, MD   20 mg at 04/10/17 0936  . potassium chloride SA (K-DUR,KLOR-CON) CR tablet 40 mEq  40 mEq Oral Once Theodis Blaze, MD      . pravastatin (PRAVACHOL) tablet 40 mg  40 mg Oral Daily Velvet Bathe, MD   40 mg at 04/10/17 0936  . sodium chloride flush (NS) 0.9 % injection 3 mL  3 mL Intravenous Q12H Velvet Bathe, MD   3 mL at 04/09/17 1017  . sodium chloride flush (NS) 0.9 % injection 3 mL  3 mL Intravenous PRN Velvet Bathe, MD      . traMADol Veatrice Bourbon) tablet 50 mg  50 mg Oral Q6H PRN Theodis Blaze, MD      . vitamin E capsule 400 Units  400 Units Oral Daily Velvet Bathe, MD   400 Units at 04/10/17 1157   Facility-Administered Medications Ordered in Other Encounters  Medication Dose Route Frequency Provider Last Rate Last Dose  . acetaminophen (TYLENOL) tablet 650 mg  650 mg Oral Once Bo Merino, MD       And  . diphenhydrAMINE (BENADRYL) capsule 25 mg  25 mg Oral Once Bo Merino, MD         Discharge Medications: Please see discharge summary for a list of discharge medications.  Relevant Imaging Results:  Relevant Lab Results:   Additional Information ssn: [phone]  Lia Hopping, LCSW

## 2017-04-10 NOTE — Progress Notes (Signed)
Patient ID: Brittany Solomon, female   DOB: 1925-10-18, 84 y.o.   MRN: [id number]    PROGRESS NOTE  [name]  MWN:[id number] DOB: 1925-07-19 DOA: 04/08/2017  PCP: Cyndi Bender, PA-C   Brief Narrative:  Pt is 84 yo female with HTN, dementia, presented after an episode of fall and has sustained left ant and sup pubic rami fractures, ortho consulted.   Assessment & Plan:   Active Problems: Left and and superior Pubic rami fractures (HCC) - per CT scan, no involvement of the acetabulum - continue with conservative management, no role for surgery at this time - PT eval done, SNF recommended, pt and family in agreement - placement pending     History of dementia - mental status at baseline this AM     Dizziness and near syncope this AM, hyponatremia  - suspect component of dehydration  - IVF provided and pt responding well - tolerating diet - BMP notable for stable Na and Cr    History of hyperlipidemia - continue statin     Leukocytosis  - reactive  - resolved     Hypokalemia  - supplement and repeat BMP in AM    Thrombocytopenia - mild, will continue to monitor     HTN - reasonable control  - continue HCTZ    Hypokalemia  - supplemented and WNL    DVT prophylaxis: Heparin SQ Code Status: Full  Family Communication: pt and family at bedside  Disposition Plan: SNF when bed available   Consultants:   Ortho   Procedures:   None  Antimicrobials:   None   Subjective: Pt reports feeling better.   Objective: Vitals:   04/09/17 1021 04/09/17 2252 04/10/17 0622 04/10/17 1406  BP: (!) 112/55 (!) 127/57 (!) 124/53 (!) 132/58  Pulse: 69 87 72 96  Resp: 18 18 18 17   Temp: 97.8 F (36.6 C) 98.6 F (37 C) 98.5 F (36.9 C) 98 F (36.7 C)  TempSrc: Oral Oral Oral Oral  SpO2: 94% 94% 92% 96%  Weight:      Height:        Intake/Output Summary (Last 24 hours) at 04/10/2017 1617 Last data filed at 04/10/2017 1425 Gross per 24 hour  Intake 1846.25 ml  Output  1150 ml  Net 696.25 ml   Filed Weights   04/08/17 1335  Weight: 70.3 kg (155 lb)   Physical Exam  Constitutional: Appears well-developed and well-nourished. No distress. HOH CVS: RRR, S1/S2 +, no murmurs, no gallops, no carotid bruit.  Pulmonary: Effort and breath sounds normal, no stridor, rhonchi, wheezes, rales.  Abdominal: Soft. BS +,  no distension, tenderness, rebound or guarding.  Musculoskeletal: Normal range of motion. No edema and no tenderness.   Data Reviewed: I have personally reviewed following labs and imaging studies  CBC: Recent Labs  Lab 04/08/17 1519 04/09/17 0552 04/10/17 0529  WBC 13.4* 7.5 7.5  NEUTROABS 11.7*  --   --   HGB 14.0 13.0 11.3*  HCT 39.2 37.7 32.2*  MCV 94.0 95.0 95.8  PLT 148* 141* 403*   Basic Metabolic Panel: Recent Labs  Lab 04/08/17 1519 04/09/17 0552 04/10/17 0529  NA 134* 136 136  K 3.3* 4.1 3.2*  CL 96* 100* 102  CO2 28 28 25   GLUCOSE 111* 96 102*  BUN 10 18 18   CREATININE 0.60 0.65 0.63  CALCIUM 9.3 9.6 8.1*   Liver Function Tests: Recent Labs  Lab 04/08/17 1519  AST 33  ALT 18  ALKPHOS  66  BILITOT 1.3*  PROT 6.1*  ALBUMIN 4.1   Cardiac Enzymes: Recent Labs  Lab 04/08/17 1519  CKTOTAL 198   Urine analysis:    Component Value Date/Time   COLORURINE YELLOW 04/08/2017 [place] 04/08/2017 1415   LABSPEC 1.011 04/08/2017 1415   PHURINE 7.0 04/08/2017 1415   GLUCOSEU NEGATIVE 04/08/2017 1415   HGBUR NEGATIVE 04/08/2017 1415   BILIRUBINUR NEGATIVE 04/08/2017 1415   KETONESUR 20 (A) 04/08/2017 1415   PROTEINUR NEGATIVE 04/08/2017 1415   NITRITE NEGATIVE 04/08/2017 1415   LEUKOCYTESUR NEGATIVE 04/08/2017 1415   Radiology Studies: No results found. Scheduled Meds: . calcium carbonate  1,250 mg Oral BID WC  . cholecalciferol  1,000 Units Oral Daily  . donepezil  10 mg Oral QHS  . feeding supplement  1 Container Oral BID BM  . ferrous sulfate  325 mg Oral BID WC  . folic acid  1 mg  Oral Daily  . heparin  5,000 Units Subcutaneous Q8H  . hydrochlorothiazide  25 mg Oral Daily  . memantine  10 mg Oral BID  . pantoprazole  40 mg Oral Daily  . PARoxetine  20 mg Oral Daily  . pravastatin  40 mg Oral Daily  . sodium chloride flush  3 mL Intravenous Q12H  . vitamin E  400 Units Oral Daily   Continuous Infusions: . sodium chloride    . sodium chloride 75 mL/hr at 04/10/17 0936     LOS: 2 days   Time spent: 25 minutes   Faye Ramsay, MD [facility] Pager [phone]  If 7PM-7AM, please contact night-coverage [email or web address] Password [facility] 04/10/2017, 4:17 PM

## 2017-04-11 DIAGNOSIS — E876 Hypokalemia: Secondary | ICD-10-CM

## 2017-04-11 DIAGNOSIS — Z8639 Personal history of other endocrine, nutritional and metabolic disease: Secondary | ICD-10-CM

## 2017-04-11 DIAGNOSIS — K219 Gastro-esophageal reflux disease without esophagitis: Secondary | ICD-10-CM | POA: Diagnosis present

## 2017-04-11 DIAGNOSIS — I10 Essential (primary) hypertension: Secondary | ICD-10-CM | POA: Diagnosis present

## 2017-04-11 DIAGNOSIS — S3282XA Multiple fractures of pelvis without disruption of pelvic ring, initial encounter for closed fracture: Secondary | ICD-10-CM

## 2017-04-11 DIAGNOSIS — E871 Hypo-osmolality and hyponatremia: Secondary | ICD-10-CM | POA: Clinically undetermined

## 2017-04-11 DIAGNOSIS — R42 Dizziness and giddiness: Secondary | ICD-10-CM

## 2017-04-11 LAB — BASIC METABOLIC PANEL
ANION GAP: 9 (ref 5–15)
BUN: 9 mg/dL (ref 6–20)
CALCIUM: 8.4 mg/dL — AB (ref 8.9–10.3)
CO2: 26 mmol/L (ref 22–32)
Chloride: 101 mmol/L (ref 101–111)
Creatinine, Ser: 0.53 mg/dL (ref 0.44–1.00)
GFR calc Af Amer: >60 mL/min (ref >60)
Glucose, Bld: 92 mg/dL (ref 65–99)
Potassium: 3.1 mmol/L — ABNORMAL LOW (ref 3.5–5.1)
SODIUM: 136 mmol/L (ref 135–145)

## 2017-04-11 LAB — MAGNESIUM: MAGNESIUM: 1.8 mg/dL (ref 1.7–2.4)

## 2017-04-11 LAB — CBC
HEMATOCRIT: 34.1 % — AB (ref 36.0–46.0)
Hemoglobin: 11.5 g/dL — ABNORMAL LOW (ref 12.0–15.0)
MCH: 31.9 pg (ref 26.0–34.0)
MCHC: 33.7 g/dL (ref 30.0–36.0)
MCV: 94.7 fL (ref 78.0–100.0)
PLATELETS: 125 10*3/uL — AB (ref 150–400)
RBC: 3.6 MIL/uL — ABNORMAL LOW (ref 3.87–5.11)
RDW: 13.4 % (ref 11.5–15.5)
WBC: 6.4 10*3/uL (ref 4.0–10.5)

## 2017-04-11 MED ORDER — POTASSIUM CHLORIDE 20 MEQ/15ML (10%) PO SOLN
40.0000 meq | ORAL | Status: AC
  Administered 2017-04-11 (×2): 40 meq via ORAL
  Filled 2017-04-11 (×2): qty 30

## 2017-04-11 MED ORDER — AMLODIPINE BESYLATE 5 MG PO TABS
5.0000 mg | ORAL_TABLET | Freq: Every day | ORAL | Status: DC
  Administered 2017-04-12: 5 mg via ORAL
  Filled 2017-04-11: qty 1

## 2017-04-11 MED ORDER — MAGNESIUM SULFATE 2 GM/50ML IV SOLN
2.0000 g | Freq: Once | INTRAVENOUS | Status: AC
  Administered 2017-04-11: 15:00:00 2 g via INTRAVENOUS
  Filled 2017-04-11: qty 50

## 2017-04-11 MED ORDER — POTASSIUM CHLORIDE 20 MEQ PO PACK: 40.0000 meq | PACK | ORAL | Status: DC

## 2017-04-11 NOTE — Care Management Important Message (Signed)
Important Message  Patient Details  Name: Brittany Solomon MRN: [id number] Date of Birth: 27-Jan-1926   Medicare Important Message Given:  Yes    Kerin Salen 04/11/2017, 12:37 [place] Message  Patient Details  Name: Brittany Solomon MRN: [id number] Date of Birth: Sep 19, 1925   Medicare Important Message Given:  Yes    Kerin Salen 04/11/2017, 12:35 PM

## 2017-04-11 NOTE — Clinical Social Work Placement (Signed)
   CLINICAL SOCIAL WORK PLACEMENT  NOTE  Date:  04/11/2017  Patient Details  Name: Brittany Solomon MRN: [id number] Date of Birth: [birth year]  Clinical Social Work is seeking post-discharge placement for this patient at the [place] level of care (*CSW will initial, date and re-position this form in  chart as items are completed):  Yes   Patient/family provided with [facility] Work Department's list of facilities offering this level of care within the geographic area requested by the patient (or if unable, by the patient's family).  Yes   Patient/family informed of their freedom to choose among providers that offer the needed level of care, that participate in Medicare, Medicaid or managed care program needed by the patient, have an available bed and are willing to accept the patient.  Yes   Patient/family informed of [facility]'s ownership interest in [facility] and [facility], as well as of the fact that they are under no obligation to receive care at these facilities.  PASRR submitted to EDS on       PASRR number received on       Existing PASRR number confirmed on 04/10/17     FL2 transmitted to all facilities in geographic area requested by pt/family on       FL2 transmitted to all facilities within larger geographic area on 04/11/17     Patient informed that his/her managed care company has contracts with or will negotiate with certain facilities, including the following:            Patient/family informed of bed offers received.  Patient chooses bed at [facility]     Physician recommends and patient chooses bed at      Patient to be transferred to [facility] on  .  Patient to be transferred to facility by [facility]     Patient family notified on   of transfer.  Name of family member notified:  Spouse     PHYSICIAN Please prepare priority discharge summary, including medications     Additional  Comment:    _______________________________________________ Lia Hopping, LCSW 04/11/2017, 11:51 AM

## 2017-04-11 NOTE — Progress Notes (Signed)
PROGRESS NOTE    Brittany Solomon  YTK:[id number] DOB: 11-25-1925 DOA: 04/08/2017 PCP: Cyndi Bender, PA-C    Brief Narrative:  Pt is 84 yo female with HTN, dementia, presented after an episode of fall and has sustained left ant and sup pubic rami fractures, ortho consulted, recommended conservative management at this time with weightbearing as tolerated, pain management, outpatient follow-up.      Assessment & Plan:   Principal Problem:   Pubic ramus fracture (HCC) Active Problems:   History of glaucoma   History of dementia   History of hyperlipidemia   Hyponatremia   Essential hypertension   Hypokalemia   Dizziness   GERD (gastroesophageal reflux disease)  #1 left superior and inferior pubic rami fracture of the pelvis Secondary to mechanical fall.  CT which was done showed no involvement of the acetabulum.  Patient has been seen in consultation by orthopedics who are recommending conservative management, weightbearing as tolerated, pain management.  Patient has been seen by PT.  Skilled nursing facility when medically stable.  2.  History of dementia Stable.  Continue Namenda and Aricept.  3.  Dizziness and near syncope/hyponatremia Felt likely secondary to component of dehydration.  Hyponatremia improved with hydration.  Follow for now and monitor.  Will discontinue HCTZ.  Follow.   4.  Reactive leukocytosis Resolved.  5.  Hypokalemia Likely secondary to diuretics/HCTZ.  Replete.  We will discontinue HCTZ.  6.  Hypertension Stable.  Discontinue HCTZ secondary to problem #3.  Place on Norvasc.  Follow.   7.  Thrombocytopenia Currently fluctuating.  No signs of bleeding.  Follow.  8.  Hyperlipidemia Continue statin.  9.  Gastroesophageal reflux disease PPI.   DVT prophylaxis: Heparin Code Status: Full Family Communication: Updated patient.  No family at bedside. Disposition Plan: Likely to skilled nursing facility tomorrow 04/12/2017 if continued medical  stability.   Consultants:   Orthopedics: Dr. Sharol Given 04/09/2017  Procedures:   Plain films of the left hip and pelvis 04/08/2017  CT pelvis 04/08/2017  CT head CT C-spine 04/08/2017  Antimicrobials:   None   Subjective: Patient sitting up in bed.  Patient complaining of back pain from sitting up in chair too long and wants to stretch out and go back to bed.  Patient states pelvic pain slowly improving however not at baseline.  Patient denies any chest pain.  No shortness of breath.  Objective: Vitals:   04/10/17 0622 04/10/17 1406 04/10/17 2105 04/11/17 0455  BP: (!) 124/53 (!) 132/58 139/62 (!) 151/61  Pulse: 72 96 76 80  Resp: 18 17 17 17   Temp: 98.5 F (36.9 C) 98 F (36.7 C) 98.8 F (37.1 C) 98 F (36.7 C)  TempSrc: Oral Oral Oral Oral  SpO2: 92% 96% 93% 95%  Weight:      Height:        Intake/Output Summary (Last 24 hours) at 04/11/2017 1649 Last data filed at 04/11/2017 1054 Gross per 24 hour  Intake 1143 ml  Output 1850 ml  Net -707 ml   Filed Weights   04/08/17 1335  Weight: 70.3 kg (155 lb)    Examination:  General exam: Appears calm and comfortable  Respiratory system: Clear to auscultation. Respiratory effort normal. Cardiovascular system: S1 & S2 heard, RRR. No JVD, murmurs, rubs, gallops or clicks. No pedal edema. Gastrointestinal system: Abdomen is nondistended, soft and nontender. No organomegaly or masses felt. Normal bowel sounds heard. Central nervous system: Alert and oriented. No focal neurological deficits. Extremities: Symmetric 5 x  5 power. Skin: No rashes, lesions or ulcers Psychiatry: Judgement and insight appear normal. Mood & affect appropriate.     Data Reviewed: I have personally reviewed following labs and imaging studies  CBC: Recent Labs  Lab 04/08/17 1519 04/09/17 0552 04/10/17 0529 04/11/17 0539  WBC 13.4* 7.5 7.5 6.4  NEUTROABS 11.7*  --   --   --   HGB 14.0 13.0 11.3* 11.5*  HCT 39.2 37.7 32.2* 34.1*  MCV 94.0  95.0 95.8 94.7  PLT 148* 141* 120* 956*   Basic Metabolic Panel: Recent Labs  Lab 04/08/17 1519 04/09/17 0552 04/10/17 0529 04/11/17 0539  NA 134* 136 136 136  K 3.3* 4.1 3.2* 3.1*  CL 96* 100* 102 101  CO2 28 28 25 26   GLUCOSE 111* 96 102* 92  BUN 10 18 18 9   CREATININE 0.60 0.65 0.63 0.53  CALCIUM 9.3 9.6 8.1* 8.4*  MG  --   --   --  1.8   GFR: Estimated Creatinine Clearance: 48.1 mL/min (by C-G formula based on SCr of 0.53 mg/dL). Liver Function Tests: Recent Labs  Lab 04/08/17 1519  AST 33  ALT 18  ALKPHOS 53  BILITOT 1.3*  PROT 6.1*  ALBUMIN 4.1   No results for input(s): LIPASE, AMYLASE in the last 168 hours. No results for input(s): AMMONIA in the last 168 hours. Coagulation Profile: No results for input(s): INR, PROTIME in the last 168 hours. Cardiac Enzymes: Recent Labs  Lab 04/08/17 1519  CKTOTAL 198   BNP (last 3 results) No results for input(s): PROBNP in the last 8760 hours. HbA1C: No results for input(s): HGBA1C in the last 72 hours. CBG: No results for input(s): GLUCAP in the last 168 hours. Lipid Profile: No results for input(s): CHOL, HDL, LDLCALC, TRIG, CHOLHDL, LDLDIRECT in the last 72 hours. Thyroid Function Tests: No results for input(s): TSH, T4TOTAL, FREET4, T3FREE, THYROIDAB in the last 72 hours. Anemia Panel: No results for input(s): VITAMINB12, FOLATE, FERRITIN, TIBC, IRON, RETICCTPCT in the last 72 hours. Sepsis Labs: No results for input(s): PROCALCITON, LATICACIDVEN in the last 168 hours.  No results found for this or any previous visit (from the past 240 hour(s)).       Radiology Studies: No results found.      Scheduled Meds: . calcium carbonate  1,250 mg Oral BID WC  . cholecalciferol  1,000 Units Oral Daily  . donepezil  10 mg Oral QHS  . feeding supplement  1 Container Oral BID BM  . ferrous sulfate  325 mg Oral BID WC  . folic acid  1 mg Oral Daily  . heparin  5,000 Units Subcutaneous Q8H  .  hydrochlorothiazide  25 mg Oral Daily  . memantine  10 mg Oral BID  . pantoprazole  40 mg Oral Daily  . PARoxetine  20 mg Oral Daily  . pravastatin  40 mg Oral Daily  . sodium chloride flush  3 mL Intravenous Q12H  . vitamin E  400 Units Oral Daily   Continuous Infusions: . sodium chloride    . sodium chloride 75 mL/hr at 04/10/17 0936     LOS: 3 days    Time spent: 35 minutes    Irine Seal, MD [facility] Pager [phone]  If 7PM-7AM, please contact night-coverage [email or web address] Password [facility] 04/11/2017, 4:49 PM

## 2017-04-11 NOTE — Progress Notes (Signed)
Physical Therapy Treatment Patient Details Name: Brittany Solomon MRN: [id number] DOB: September 29, 1925 Today's Date: 04/11/2017    History of Present Illness Brittany Solomon is a 84 y.o. female who presents with left inferior and superior pubic rami fractures after a fall  on 04/08/17. Per ortho, OK for WBAT. H/O L ankle surgery/fusion    PT Comments    Pt in bed incont BM.  Assisted with hygiene and assisted to [facility]  Attempted amb + 2 assist.  See mobility details below.  Pt will need ST Rehab at SNF.   Follow Up Recommendations  SNF     Equipment Recommendations  None recommended by PT    Recommendations for Other Services       Precautions / Restrictions Precautions Precautions: Fall Precaution Comments: HOH Restrictions Weight Bearing Restrictions: No Other Position/Activity Restrictions: WBAT    Mobility  Bed Mobility Overal bed mobility: Needs Assistance Bed Mobility: Supine to Sit     Supine to sit: Max assist     General bed mobility comments: assisted OOB to [facility]  Transfers Overall transfer level: Needs assistance Equipment used: Rolling walker (2 wheeled);None Transfers: Sit to/from [employer] to Stand: Max assist;+2 physical assistance;+2 safety/equipment Stand pivot transfers: Max assist;+2 physical assistance;+2 safety/equipment       General transfer comment: assisted from bed to [facility] then off BSC to attempt amb  Ambulation/Gait Ambulation/Gait assistance: +2 physical assistance;+2 safety/equipment;Mod assist;Max assist Ambulation Distance (Feet): 2 Feet Assistive device: Rolling walker (2 wheeled) Gait Pattern/deviations: Step-to pattern;Step-through pattern;Decreased stance time - left Gait velocity: decreased   General Gait Details: attempted amb + 2 side by side assist to attempt gait.  Great difficulty tolerating full WBing L side with increased pain.     Stairs            Wheelchair Mobility    Modified Rankin (Stroke  Patients Only)       Balance                                            Cognition Arousal/Alertness: Awake/alert Behavior During Therapy: WFL for tasks assessed/performed Overall Cognitive Status: No family/caregiver present to determine baseline cognitive functioning                                 General Comments: pleasant and following commands      Exercises      General Comments        Pertinent Vitals/Pain Pain Assessment: 0-10 Faces Pain Scale: Hurts even more Pain Location: left pevic area and back Pain Descriptors / Indicators: Discomfort;Grimacing;Guarding;Sharp Pain Intervention(s): Monitored during session;Repositioned    Home Living                      Prior Function            PT Goals (current goals can now be found in the care plan section) Progress towards PT goals: Progressing toward goals    Frequency    Min 2X/week      PT Plan      Co-evaluation              AM-PAC PT "6 Clicks" Daily Activity  Outcome Measure  Difficulty turning over in bed (including adjusting bedclothes, sheets and blankets)?: Unable Difficulty moving  from lying on back to sitting on the side of the bed? : Unable Difficulty sitting down on and standing up from a chair with arms (e.g., wheelchair, bedside commode, etc,.)?: Unable Help needed moving to and from a bed to chair (including a wheelchair)?: Total Help needed walking in hospital room?: Total Help needed climbing 3-5 steps with a railing? : Total 6 Click Score: 6    End of Session Equipment Utilized During Treatment: Gait belt Activity Tolerance: Patient limited by pain Patient left: in chair Nurse Communication: Mobility status PT Visit Diagnosis: History of falling (Z91.81)     Time: 1022-1050 PT Time Calculation (min) (ACUTE ONLY): 28 min  Charges:  $Gait Training: 8-22 mins $Therapeutic Activity: 8-22 mins                    G Codes:        Rica Koyanagi  PTA [facility]  Acute  Rehab Pager      [phone]

## 2017-04-12 DIAGNOSIS — M6281 Muscle weakness (generalized): Secondary | ICD-10-CM | POA: Diagnosis not present

## 2017-04-12 DIAGNOSIS — S32502D Unspecified fracture of left pubis, subsequent encounter for fracture with routine healing: Secondary | ICD-10-CM | POA: Diagnosis not present

## 2017-04-12 DIAGNOSIS — M6389 Disorders of muscle in diseases classified elsewhere, multiple sites: Secondary | ICD-10-CM | POA: Diagnosis not present

## 2017-04-12 DIAGNOSIS — R278 Other lack of coordination: Secondary | ICD-10-CM | POA: Diagnosis not present

## 2017-04-12 DIAGNOSIS — F0390 Unspecified dementia without behavioral disturbance: Secondary | ICD-10-CM | POA: Diagnosis not present

## 2017-04-12 DIAGNOSIS — S79911A Unspecified injury of right hip, initial encounter: Secondary | ICD-10-CM | POA: Diagnosis not present

## 2017-04-12 DIAGNOSIS — F0280 Dementia in other diseases classified elsewhere without behavioral disturbance: Secondary | ICD-10-CM | POA: Diagnosis not present

## 2017-04-12 DIAGNOSIS — R42 Dizziness and giddiness: Secondary | ICD-10-CM

## 2017-04-12 DIAGNOSIS — S32592D Other specified fracture of left pubis, subsequent encounter for fracture with routine healing: Secondary | ICD-10-CM

## 2017-04-12 DIAGNOSIS — E871 Hypo-osmolality and hyponatremia: Secondary | ICD-10-CM | POA: Diagnosis not present

## 2017-04-12 DIAGNOSIS — S32509A Unspecified fracture of unspecified pubis, initial encounter for closed fracture: Secondary | ICD-10-CM | POA: Diagnosis not present

## 2017-04-12 DIAGNOSIS — S3282XD Multiple fractures of pelvis without disruption of pelvic ring, subsequent encounter for fracture with routine healing: Secondary | ICD-10-CM | POA: Diagnosis not present

## 2017-04-12 DIAGNOSIS — I10 Essential (primary) hypertension: Secondary | ICD-10-CM

## 2017-04-12 DIAGNOSIS — E785 Hyperlipidemia, unspecified: Secondary | ICD-10-CM | POA: Diagnosis not present

## 2017-04-12 DIAGNOSIS — Z8659 Personal history of other mental and behavioral disorders: Secondary | ICD-10-CM

## 2017-04-12 DIAGNOSIS — Z4789 Encounter for other orthopedic aftercare: Secondary | ICD-10-CM | POA: Diagnosis not present

## 2017-04-12 DIAGNOSIS — G8911 Acute pain due to trauma: Secondary | ICD-10-CM | POA: Diagnosis not present

## 2017-04-12 DIAGNOSIS — R41841 Cognitive communication deficit: Secondary | ICD-10-CM | POA: Diagnosis not present

## 2017-04-12 DIAGNOSIS — R2681 Unsteadiness on feet: Secondary | ICD-10-CM | POA: Diagnosis not present

## 2017-04-12 LAB — BASIC METABOLIC PANEL
ANION GAP: 8 (ref 5–15)
BUN: 9 mg/dL (ref 6–20)
CALCIUM: 8.4 mg/dL — AB (ref 8.9–10.3)
CO2: 28 mmol/L (ref 22–32)
Chloride: 100 mmol/L — ABNORMAL LOW (ref 101–111)
Creatinine, Ser: 0.62 mg/dL (ref 0.44–1.00)
GFR calc non Af Amer: >60 mL/min (ref >60)
Glucose, Bld: 94 mg/dL (ref 65–99)
Potassium: 3.7 mmol/L (ref 3.5–5.1)
Sodium: 136 mmol/L (ref 135–145)

## 2017-04-12 LAB — CBC
HEMATOCRIT: 35 % — AB (ref 36.0–46.0)
HEMOGLOBIN: 11.8 g/dL — AB (ref 12.0–15.0)
MCH: 32.7 pg (ref 26.0–34.0)
MCHC: 33.7 g/dL (ref 30.0–36.0)
MCV: 97 fL (ref 78.0–100.0)
Platelets: 133 10*3/uL — ABNORMAL LOW (ref 150–400)
RBC: 3.61 MIL/uL — AB (ref 3.87–5.11)
RDW: 13.9 % (ref 11.5–15.5)
WBC: 6.2 10*3/uL (ref 4.0–10.5)

## 2017-04-12 LAB — MAGNESIUM: MAGNESIUM: 2.4 mg/dL (ref 1.7–2.4)

## 2017-04-12 MED ORDER — OXYCODONE HCL 5 MG PO TABS
5.0000 mg | ORAL_TABLET | Freq: Four times a day (QID) | ORAL | 0 refills | Status: DC | PRN
Start: 2017-04-12 — End: 2017-06-20

## 2017-04-12 MED ORDER — ACETAMINOPHEN 325 MG PO TABS: 650.0000 mg | ORAL_TABLET | Freq: Four times a day (QID) | ORAL | Status: AC | PRN

## 2017-04-12 MED ORDER — AMLODIPINE BESYLATE 5 MG PO TABS: 5.0000 mg | ORAL_TABLET | Freq: Every day | ORAL | Status: AC

## 2017-04-12 MED ORDER — CELECOXIB 100 MG PO CAPS: 100.0000 mg | ORAL_CAPSULE | ORAL | Status: AC

## 2017-04-12 MED ORDER — TRAMADOL HCL 50 MG PO TABS
50.0000 mg | ORAL_TABLET | Freq: Four times a day (QID) | ORAL | 0 refills | Status: AC | PRN
Start: 2017-04-12 — End: ?

## 2017-04-12 NOTE — Clinical Social Work Placement (Signed)
Patient received and accepted bed offer at [facility]. Facility aware of patient's discharge and confirmed patient's bed offer. PTAR contacted, patient's family aware. Patient's RN can call report to [phone] Room 103B, packet complete. CSW signing off, no other needs identified at this time.  CLINICAL SOCIAL WORK PLACEMENT  NOTE  Date:  04/12/2017  Patient Details  Name: Brittany Solomon MRN: [id number] Date of Birth: 20-Feb-1926  Clinical Social Work is seeking post-discharge placement for this patient at the [place] level of care (*CSW will initial, date and re-position this form in  chart as items are completed):  Yes   Patient/family provided with [place] Work Department's list of facilities offering this level of care within the geographic area requested by the patient (or if unable, by the patient's family).  Yes   Patient/family informed of their freedom to choose among providers that offer the needed level of care, that participate in Medicare, Medicaid or managed care program needed by the patient, have an available bed and are willing to accept the patient.  Yes   Patient/family informed of [place]'s ownership interest in [facility] and [facility], as well as of the fact that they are under no obligation to receive care at these facilities.  PASRR submitted to EDS on       PASRR number received on       Existing PASRR number confirmed on 04/10/17     FL2 transmitted to all facilities in geographic area requested by pt/family on 04/10/17     FL2 transmitted to all facilities within larger geographic area on 04/11/17     Patient informed that his/her managed care company has contracts with or will negotiate with certain facilities, including the following:            Patient/family informed of bed offers received.  Patient chooses bed at [place], [place]     Physician recommends and patient chooses bed at        Patient to be transferred to [facility] on 04/12/17.  Patient to be transferred to facility by PTAR     Patient family notified on 04/12/17 of transfer.  Name of family member notified:  Ritchie Crockett     PHYSICIAN       Additional Comment:    _______________________________________________ Burnis Medin, LCSW 04/12/2017, 11:27 AM

## 2017-04-12 NOTE — Discharge Summary (Signed)
Physician Discharge Summary  Brittany Solomon:[id number] DOB: [birth year] DOA: 04/08/2017  PCP: Brittany Bender, PA-C  Admit date: 04/08/2017 Discharge date: 04/12/2017   Recommendations for Outpatient Follow-Up:   1. Weight bearing as tolerated 2. Outpatient follow up with ortho   Discharge Diagnosis:   Principal Problem:   Pubic ramus fracture (HCC) Active Problems:   History of glaucoma   History of dementia   History of hyperlipidemia   Hyponatremia   Essential hypertension   Hypokalemia   Dizziness   GERD (gastroesophageal reflux disease)   Multiple closed pelvic fractures without disruption of pelvic circle [name])   Discharge disposition:  SNF:  Discharge Condition: Improved.  Diet recommendation: Regular.  Wound care: None.   History of Present Illness:   Pt is 84 yo female with HTN, dementia, presented after an episode of fall and has sustained left ant and sup pubic rami fractures, ortho consulted, recommended conservative management at this time with weightbearing as tolerated, pain management, outpatient follow-up.     Hospital Course by Problem:   1 left superior and inferior pubic rami fracture of the pelvis Secondary to mechanical fall.  CT which was done showed no involvement of the acetabulum.  Patient has been seen in consultation by orthopedics who are recommending conservative management, weightbearing as tolerated, pain management.  Patient has been seen by PT.  Skilled nursing facility for rehab  2.  History of dementia Stable.  Continue home meds  3.  Dizziness and near syncope/hyponatremia Felt likely secondary to component of dehydration.  Hyponatremia improved with hydration.  Follow for now and monitor.  d/c HCTZ   4.  Reactive leukocytosis Resolved.  5.  Hypokalemia Likely secondary to diuretics/HCTZ.  Replete.  discontinue HCTZ.  6.  Hypertension Stable.  Discontinue HCTZ secondary to problem #3.  Place on Norvasc instead     7.  Thrombocytopenia Currently fluctuating.  No signs of bleeding.  Follow.  8.  Hyperlipidemia Continue statin.        Medical Consultants:    ortho   Discharge Exam:   Vitals:   04/11/17 2103 04/12/17 0546  BP: (!) 130/50 (!) 135/49  Pulse: 86 79  Resp: 17 18  Temp: 98.6 F (37 C) 98.3 F (36.8 C)  SpO2: 96% 97%   Vitals:   04/10/17 2105 04/11/17 0455 04/11/17 2103 04/12/17 0546  BP: 139/62 (!) 151/61 (!) 130/50 (!) 135/49  Pulse: 76 80 86 79  Resp: 17 17 17 18   Temp: 98.8 F (37.1 C) 98 F (36.7 C) 98.6 F (37 C) 98.3 F (36.8 C)  TempSrc: Oral Oral Oral Oral  SpO2: 93% 95% 96% 97%  Weight:      Height:        Gen:  NAD-- hard of hearing   The results of significant diagnostics from this hospitalization (including imaging, microbiology, ancillary and laboratory) are listed below for reference.     Procedures and Diagnostic Studies:   Ct Head Wo Contrast  Result Date: 04/08/2017 CLINICAL DATA:  Fall at home while walking to bathroom. Left hip pain. EXAM: CT HEAD WITHOUT CONTRAST CT CERVICAL SPINE WITHOUT CONTRAST TECHNIQUE: Multidetector CT imaging of the head and cervical spine was performed following the standard protocol without intravenous contrast. Multiplanar CT image reconstructions of the cervical spine were also generated. COMPARISON:  None. FINDINGS: CT HEAD FINDINGS Brain: Ventricles, cisterns and other CSF spaces are within normal as there is minimal age related atrophic change. There is moderate chronic ischemic microvascular  disease. There is no mass, mass effect, shift of midline structures or acute hemorrhage. No evidence of acute infarction. Minimal basal ganglia calcifications. Vascular: No hyperdense vessel or unexpected calcification. Skull: Normal. Negative for fracture or focal lesion. Sinuses/Orbits: No acute finding. Other: None. CT CERVICAL SPINE FINDINGS Alignment: Very subtle 1-2 mm anterior subluxation of C3 on C4 likely  degenerative. Skull base and vertebrae: Vertebral body heights are normal. There is mild to moderate spondylosis throughout the cervical spine. Atlantoaxial articulation is within normal. There is uncovertebral joint spurring and facet arthropathy. No acute fracture. Severe bilateral neural foraminal narrowing at multiple levels due to adjacent bony spurring. Soft tissues and spinal canal: No prevertebral fluid or swelling. No visible canal hematoma. Disc levels: Moderate disc space narrowing at the C4-5, C5-6 and C6-7 levels. Upper chest: Negative. Other: None. IMPRESSION: No acute brain injury. Moderate chronic ischemic microvascular disease and mild age related atrophic change. No acute cervical spine injury. Mild to moderate spondylosis of the cervical spine with multilevel disc disease and significant bilateral multilevel neural foraminal narrowing due to adjacent bony spurring. Electronically Signed   By: Marin Olp M.D.   On: 04/08/2017 14:55   Ct Cervical Spine Wo Contrast  Result Date: 04/08/2017 CLINICAL DATA:  Fall at home while walking to bathroom. Left hip pain. EXAM: CT HEAD WITHOUT CONTRAST CT CERVICAL SPINE WITHOUT CONTRAST TECHNIQUE: Multidetector CT imaging of the head and cervical spine was performed following the standard protocol without intravenous contrast. Multiplanar CT image reconstructions of the cervical spine were also generated. COMPARISON:  None. FINDINGS: CT HEAD FINDINGS Brain: Ventricles, cisterns and other CSF spaces are within normal as there is minimal age related atrophic change. There is moderate chronic ischemic microvascular disease. There is no mass, mass effect, shift of midline structures or acute hemorrhage. No evidence of acute infarction. Minimal basal ganglia calcifications. Vascular: No hyperdense vessel or unexpected calcification. Skull: Normal. Negative for fracture or focal lesion. Sinuses/Orbits: No acute finding. Other: None. CT CERVICAL SPINE FINDINGS  Alignment: Very subtle 1-2 mm anterior subluxation of C3 on C4 likely degenerative. Skull base and vertebrae: Vertebral body heights are normal. There is mild to moderate spondylosis throughout the cervical spine. Atlantoaxial articulation is within normal. There is uncovertebral joint spurring and facet arthropathy. No acute fracture. Severe bilateral neural foraminal narrowing at multiple levels due to adjacent bony spurring. Soft tissues and spinal canal: No prevertebral fluid or swelling. No visible canal hematoma. Disc levels: Moderate disc space narrowing at the C4-5, C5-6 and C6-7 levels. Upper chest: Negative. Other: None. IMPRESSION: No acute brain injury. Moderate chronic ischemic microvascular disease and mild age related atrophic change. No acute cervical spine injury. Mild to moderate spondylosis of the cervical spine with multilevel disc disease and significant bilateral multilevel neural foraminal narrowing due to adjacent bony spurring. Electronically Signed   By: Marin Olp M.D.   On: 04/08/2017 14:55   Ct Pelvis Wo Contrast  Result Date: 04/08/2017 CLINICAL DATA:  Left hip pain after fall. Pubic rami fractures seen on x-ray. EXAM: CT PELVIS WITHOUT CONTRAST TECHNIQUE: Multidetector CT imaging of the pelvis was performed following the standard protocol without intravenous contrast. COMPARISON:  Left hip x-rays from same day. FINDINGS: Urinary Tract:  No abnormality visualized. Bowel: Colonic diverticulosis. Otherwise unremarkable visualized pelvic bowel loops. Vascular/Lymphatic: Mild aortic atherosclerosis. No lymphadenopathy. Reproductive:  Prior hysterectomy.  No adnexal mass. Other:  None. Musculoskeletal: Acute, transverse, nondisplaced fracture of the left puboacetabular junction. Acute, vertical, nondisplaced fracture through the left  inferior pubic ramus. No additional fracture seen. Prior posterior decompression at L4-L5 and L5-S1. Severe lower lumbar degenerative disc disease and  facet arthropathy. Osteopenia. IMPRESSION: 1. Acute, nondisplaced fractures of the left puboacetabular junction and left inferior pubic ramus. 2.  Aortic atherosclerosis (ICD10-I70.0). Electronically Signed   By: Titus Dubin M.D.   On: 04/08/2017 15:02   Dg Hip Unilat With Pelvis 2-3 Views Left  Result Date: 04/08/2017 CLINICAL DATA:  Fall.  Left hip pain. EXAM: DG HIP (WITH OR WITHOUT PELVIS) 2-3V LEFT COMPARISON:  DEXA scan November 02, 2014 FINDINGS: Soft tissue calcifications are seen adjacent to the right hip and overlying the right femoral neck, unchanged since the comparison DEXA scan. No acute right hip fracture based on a single frontal view. Fractures are seen in the left pubic bones. There is a fracture through the inferior pubic ramus. There is a no other fracture through the superior ramus near its junction with the acetabulum. It is possible the fracture affects the acetabulum. The lower lumbar spine and sacrum are grossly unremarkable. No fracture seen through the left proximal femur. No dislocation. IMPRESSION: 1. There is a fracture at the base of the left superior ramus which could extend into the acetabulum. CT imaging could better evaluate. Left inferior pubic ramus fracture. No fracture of the proximal left femur. 2. Chronic changes on the right. Electronically Signed   By: Dorise Bullion III M.D   On: 04/08/2017 14:00     Labs:   Basic Metabolic Panel: Recent Labs  Lab 04/08/17 1519 04/09/17 4081 04/10/17 0529 04/11/17 0539 04/12/17 0535  NA 134* 136 136 136 136  K 3.3* 4.1 3.2* 3.1* 3.7  CL 96* 100* 102 101 100*  CO2 28 28 25 26 28   GLUCOSE 111* 96 102* 92 94  BUN 10 18 18 9 9   CREATININE 0.60 0.65 0.63 0.53 0.62  CALCIUM 9.3 9.6 8.1* 8.4* 8.4*  MG  --   --   --  1.8 2.4   GFR Estimated Creatinine Clearance: 48.1 mL/min (by C-G formula based on SCr of 0.62 mg/dL). Liver Function Tests: Recent Labs  Lab 04/08/17 1519  AST 33  ALT 18  ALKPHOS 53    BILITOT 1.3*  PROT 6.1*  ALBUMIN 4.1   No results for input(s): LIPASE, AMYLASE in the last 168 hours. No results for input(s): AMMONIA in the last 168 hours. Coagulation profile No results for input(s): INR, PROTIME in the last 168 hours.  CBC: Recent Labs  Lab 04/08/17 1519 04/09/17 0552 04/10/17 0529 04/11/17 0539 04/12/17 0535  WBC 13.4* 7.5 7.5 6.4 6.2  NEUTROABS 11.7*  --   --   --   --   HGB 14.0 13.0 11.3* 11.5* 11.8*  HCT 39.2 37.7 32.2* 34.1* 35.0*  MCV 94.0 95.0 95.8 94.7 97.0  PLT 148* 141* 120* 125* 133*   Cardiac Enzymes: Recent Labs  Lab 04/08/17 1519  CKTOTAL 198   BNP: Invalid input(s): POCBNP CBG: No results for input(s): GLUCAP in the last 168 hours. D-Dimer No results for input(s): DDIMER in the last 72 hours. Hgb A1c No results for input(s): HGBA1C in the last 72 hours. Lipid Profile No results for input(s): CHOL, HDL, LDLCALC, TRIG, CHOLHDL, LDLDIRECT in the last 72 hours. Thyroid function studies No results for input(s): TSH, T4TOTAL, T3FREE, THYROIDAB in the last 72 hours.  Invalid input(s): FREET3 Anemia work up No results for input(s): VITAMINB12, FOLATE, FERRITIN, TIBC, IRON, RETICCTPCT in the last 72 hours. Microbiology No  results found for this or any previous visit (from the past 240 hour(s)).   Discharge Instructions:   Discharge Instructions    Diet general   Complete by:  As directed    Increase activity slowly   Complete by:  As directed      Allergies as of 04/12/2017      Reactions   Amoxicillin Rash      Medication List    STOP taking these medications   aspirin EC 81 MG tablet   hydrochlorothiazide 25 MG tablet Commonly known as:  HYDRODIURIL   potassium chloride SA 20 MEQ tablet Commonly known as:  K-DUR,KLOR-CON   SIMPONI ARIA IV     TAKE these medications   acetaminophen 325 MG tablet Commonly known as:  TYLENOL Take 2 tablets (650 mg total) by mouth every 6 (six) hours as needed for mild  pain.   amLODipine 5 MG tablet Commonly known as:  NORVASC Take 1 tablet (5 mg total) by mouth daily.   calcium carbonate 600 MG Tabs tablet Commonly known as:  OS-CAL Take 600 mg by mouth 2 (two) times daily with a meal.   celecoxib 100 MG capsule Commonly known as:  CELEBREX Take 1 capsule (100 mg total) by mouth every Monday, Wednesday, and Friday. Take 100mg  by mouth ONLY on MWF Start taking on:  04/13/2017 What changed:    when to take this  reasons to take this  additional instructions   donepezil 10 MG tablet Commonly known as:  ARICEPT Take 1 tablet (10 mg total) by mouth at bedtime.   ferrous sulfate 325 (65 FE) MG tablet Take 325 mg by mouth 2 (two) times daily with a meal.   folic acid 1 MG tablet Commonly known as:  FOLVITE Take 1 mg by mouth daily.   memantine 10 MG tablet Commonly known as:  NAMENDA Take 1 tablet (10 mg total) by mouth 2 (two) times daily.   methotrexate 2.5 MG tablet Commonly known as:  RHEUMATREX TAKE 4 TABLETS BY MOUTH  ONCE EVERY WEEK   omeprazole 20 MG capsule Commonly known as:  PRILOSEC Take 20 mg by mouth daily.   oxyCODONE 5 MG immediate release tablet Commonly known as:  Oxy IR/ROXICODONE Take 1 tablet (5 mg total) by mouth every 6 (six) hours as needed for severe pain or breakthrough pain.   PARoxetine 20 MG tablet Commonly known as:  PAXIL Take 1 tablet by mouth daily.   pravastatin 40 MG tablet Commonly known as:  PRAVACHOL Take 1 tablet by mouth daily.   PRESERVISION AREDS 2 Caps Take by mouth.   traMADol 50 MG tablet Commonly known as:  ULTRAM Take 1 tablet (50 mg total) by mouth every 6 (six) hours as needed for moderate pain.   Vitamin D3 400 units Caps Take 1 capsule by mouth daily.   vitamin E 400 UNIT capsule Generic drug:  vitamin E Take 400 Units by mouth daily.      Follow-up Information    Newt Minion, MD Follow up in 2 week(s).   Specialty:  Orthopedic Surgery Contact  information: [place] [address] [phone]        Brittany Bender, PA-C Follow up in 1 week(s).   Specialty:  Physician Assistant Contact information: [place] [address] [phone]            Time coordinating discharge: 35 min  Signed:  Geradine Girt   [facility] 04/12/2017, 8:13  AM

## 2017-04-12 NOTE — Progress Notes (Addendum)
Patient is being discharge to [facility]. Discharge packages were given to transporter. Report was given to kelly at [facility].

## 2017-04-16 ENCOUNTER — Ambulatory Visit (HOSPITAL_COMMUNITY): Payer: Medicare Other

## 2017-04-17 ENCOUNTER — Encounter (INDEPENDENT_AMBULATORY_CARE_PROVIDER_SITE_OTHER): Payer: Self-pay | Admitting: Orthopedic Surgery

## 2017-04-17 ENCOUNTER — Ambulatory Visit (INDEPENDENT_AMBULATORY_CARE_PROVIDER_SITE_OTHER): Payer: Medicare Other | Admitting: Orthopedic Surgery

## 2017-04-17 ENCOUNTER — Ambulatory Visit: Payer: Medicare Other | Admitting: Rheumatology

## 2017-04-17 VITALS — Ht 68.0 in | Wt 155.0 lb

## 2017-04-17 DIAGNOSIS — S32592D Other specified fracture of left pubis, subsequent encounter for fracture with routine healing: Secondary | ICD-10-CM

## 2017-04-17 NOTE — Progress Notes (Signed)
Office Visit Note   Patient: Brittany Solomon           Date of Birth: July 24, 1925           MRN: [id number] Visit Date: 04/17/2017              Requested by: Cyndi Bender, PA-C [address] [place], [place] [address] PCP: Cyndi Bender, PA-C  Chief Complaint  Patient presents with  . Pelvis - Follow-up    DOI 04/08/17 left inferior and superior pubic rami fx       HPI: Patient is a 84 year old woman with dementia who fell unknown mechanism of injury sustaining a left inferior and superior pubic rami fracture.  Patient is currently its collapsed skilled nursing currently undergoing physical therapy denies any problems with participating with physical therapy.  Assessment & Plan: Visit Diagnoses:  1. Closed fracture of ramus of left pubis with routine healing, subsequent encounter     Plan: Recommended continuing with her physical therapy weightbearing as tolerated.  Will obtain an AP of the pelvis radiograph at follow-up in 4 weeks.  Patient is long-term placement will be dependent on her dementia.  Follow-Up Instructions: Return in about 4 weeks (around 05/15/2017).   Ortho Exam  Patient is alert,  Not oriented, no adenopathy, well-dressed, normal affect, normal respiratory effort. Examination patient ambulates in a wheelchair.  She does not have pain with range of motion of the hip knee or ankle.  Radiographs were not obtained.  She is about 2 weeks out from her injury.  Imaging: No results found. No images are attached to the encounter.  Labs: No results found for: HGBA1C, ESRSEDRATE, CRP, LABURIC, REPTSTATUS, GRAMSTAIN, CULT, LABORGA  @LABSALLVALUES (HGBA1)@  Body mass index is 23.57 kg/m.  Orders:  No orders of the defined types were placed in this encounter.  No orders of the defined types were placed in this encounter.    Procedures: No procedures performed  Clinical Data: No additional findings.  ROS:  All other systems negative, except as noted in the  HPI. Review of Systems  Objective: Vital Signs: Ht 5\' 8"  (1.727 m)   Wt 155 lb (70.3 kg)   BMI 23.57 kg/m   Specialty Comments:  No specialty comments available.  PMFS History: Patient Active Problem List   Diagnosis Date Noted  . Hyponatremia 04/11/2017  . Essential hypertension 04/11/2017  . Hypokalemia 04/11/2017  . Dizziness 04/11/2017  . GERD (gastroesophageal reflux disease) 04/11/2017  . Multiple closed pelvic fractures without disruption of pelvic circle (HCC)   . Pubic ramus fracture ([place]) 04/08/2017  . History of total replacement of right ankle 04/17/2016  . Primary osteoarthritis of both hands 04/10/2016  . Primary osteoarthritis of both knees 04/10/2016  . Primary osteoarthritis of both feet 04/10/2016  . Spondylosis of lumbar region without myelopathy or radiculopathy 04/10/2016  . Age-related osteoporosis without current pathological fracture 04/10/2016  . High risk medication use 04/10/2016  . History of glaucoma 04/10/2016  . History of dementia 04/10/2016  . History of anemia 04/10/2016  . History of hyperlipidemia 04/10/2016  . Primary osteoarthritis of right ankle 04/10/2016  . Rheumatoid arthritis of multiple sites without organ or system involvement with positive rheumatoid factor ([place]) 10/25/2015   Past Medical History:  Diagnosis Date  . Anemia   . Benign neoplasm of colon   . Depression   . Diverticulitis   . Hearing impairment   . Hypercholesteremia   . Hypertension   . Memory loss   .  Osteoporosis   . Rheumatoid arthritis ([place])   . Shingles   . Spinal stenosis     Family History  Problem Relation Age of Onset  . Parkinsonism Mother   . Heart attack Father     Past Surgical History:  Procedure Laterality Date  . ANKLE SURGERY    . BACK SURGERY    . GALLBLADDER SURGERY    . TONSILLECTOMY AND ADENOIDECTOMY    . VAGINAL HYSTERECTOMY     Social History   Occupational History  . Occupation: Retired  Tobacco Use  . Smoking  status: Never Smoker  . Smokeless tobacco: Never Used  Substance and Sexual Activity  . Alcohol use: No  . Drug use: No  . Sexual activity: Not on file

## 2017-05-04 DIAGNOSIS — N390 Urinary tract infection, site not specified: Secondary | ICD-10-CM | POA: Diagnosis not present

## 2017-05-06 DIAGNOSIS — N390 Urinary tract infection, site not specified: Secondary | ICD-10-CM | POA: Diagnosis not present

## 2017-05-08 DIAGNOSIS — F329 Major depressive disorder, single episode, unspecified: Secondary | ICD-10-CM | POA: Diagnosis not present

## 2017-05-08 DIAGNOSIS — S32502D Unspecified fracture of left pubis, subsequent encounter for fracture with routine healing: Secondary | ICD-10-CM | POA: Diagnosis not present

## 2017-05-08 DIAGNOSIS — Z9181 History of falling: Secondary | ICD-10-CM | POA: Diagnosis not present

## 2017-05-08 DIAGNOSIS — D509 Iron deficiency anemia, unspecified: Secondary | ICD-10-CM | POA: Diagnosis not present

## 2017-05-08 DIAGNOSIS — I10 Essential (primary) hypertension: Secondary | ICD-10-CM | POA: Diagnosis not present

## 2017-05-08 DIAGNOSIS — F0391 Unspecified dementia with behavioral disturbance: Secondary | ICD-10-CM | POA: Diagnosis not present

## 2017-05-08 DIAGNOSIS — I700 Atherosclerosis of aorta: Secondary | ICD-10-CM | POA: Diagnosis not present

## 2017-05-08 DIAGNOSIS — R41841 Cognitive communication deficit: Secondary | ICD-10-CM | POA: Diagnosis not present

## 2017-05-08 DIAGNOSIS — S3282XD Multiple fractures of pelvis without disruption of pelvic ring, subsequent encounter for fracture with routine healing: Secondary | ICD-10-CM | POA: Diagnosis not present

## 2017-05-10 DIAGNOSIS — I10 Essential (primary) hypertension: Secondary | ICD-10-CM | POA: Diagnosis not present

## 2017-05-10 DIAGNOSIS — I2510 Atherosclerotic heart disease of native coronary artery without angina pectoris: Secondary | ICD-10-CM | POA: Diagnosis not present

## 2017-05-10 DIAGNOSIS — E7849 Other hyperlipidemia: Secondary | ICD-10-CM | POA: Diagnosis not present

## 2017-05-10 DIAGNOSIS — K219 Gastro-esophageal reflux disease without esophagitis: Secondary | ICD-10-CM | POA: Diagnosis not present

## 2017-05-10 DIAGNOSIS — H918X3 Other specified hearing loss, bilateral: Secondary | ICD-10-CM | POA: Diagnosis not present

## 2017-05-10 DIAGNOSIS — R531 Weakness: Secondary | ICD-10-CM | POA: Diagnosis not present

## 2017-05-10 DIAGNOSIS — R262 Difficulty in walking, not elsewhere classified: Secondary | ICD-10-CM | POA: Diagnosis not present

## 2017-05-10 DIAGNOSIS — G8929 Other chronic pain: Secondary | ICD-10-CM | POA: Diagnosis not present

## 2017-05-11 DIAGNOSIS — I10 Essential (primary) hypertension: Secondary | ICD-10-CM | POA: Diagnosis not present

## 2017-05-11 DIAGNOSIS — I700 Atherosclerosis of aorta: Secondary | ICD-10-CM | POA: Diagnosis not present

## 2017-05-11 DIAGNOSIS — R41841 Cognitive communication deficit: Secondary | ICD-10-CM | POA: Diagnosis not present

## 2017-05-11 DIAGNOSIS — S3282XD Multiple fractures of pelvis without disruption of pelvic ring, subsequent encounter for fracture with routine healing: Secondary | ICD-10-CM | POA: Diagnosis not present

## 2017-05-11 DIAGNOSIS — S32502D Unspecified fracture of left pubis, subsequent encounter for fracture with routine healing: Secondary | ICD-10-CM | POA: Diagnosis not present

## 2017-05-11 DIAGNOSIS — F0391 Unspecified dementia with behavioral disturbance: Secondary | ICD-10-CM | POA: Diagnosis not present

## 2017-05-14 DIAGNOSIS — I700 Atherosclerosis of aorta: Secondary | ICD-10-CM | POA: Diagnosis not present

## 2017-05-14 DIAGNOSIS — S3282XD Multiple fractures of pelvis without disruption of pelvic ring, subsequent encounter for fracture with routine healing: Secondary | ICD-10-CM | POA: Diagnosis not present

## 2017-05-14 DIAGNOSIS — S32502D Unspecified fracture of left pubis, subsequent encounter for fracture with routine healing: Secondary | ICD-10-CM | POA: Diagnosis not present

## 2017-05-14 DIAGNOSIS — R41841 Cognitive communication deficit: Secondary | ICD-10-CM | POA: Diagnosis not present

## 2017-05-14 DIAGNOSIS — I10 Essential (primary) hypertension: Secondary | ICD-10-CM | POA: Diagnosis not present

## 2017-05-14 DIAGNOSIS — F0391 Unspecified dementia with behavioral disturbance: Secondary | ICD-10-CM | POA: Diagnosis not present

## 2017-05-15 ENCOUNTER — Ambulatory Visit (INDEPENDENT_AMBULATORY_CARE_PROVIDER_SITE_OTHER): Payer: Medicare Other | Admitting: Orthopedic Surgery

## 2017-05-15 DIAGNOSIS — F0391 Unspecified dementia with behavioral disturbance: Secondary | ICD-10-CM | POA: Diagnosis not present

## 2017-05-15 DIAGNOSIS — S32502D Unspecified fracture of left pubis, subsequent encounter for fracture with routine healing: Secondary | ICD-10-CM | POA: Diagnosis not present

## 2017-05-15 DIAGNOSIS — I10 Essential (primary) hypertension: Secondary | ICD-10-CM | POA: Diagnosis not present

## 2017-05-15 DIAGNOSIS — R41841 Cognitive communication deficit: Secondary | ICD-10-CM | POA: Diagnosis not present

## 2017-05-15 DIAGNOSIS — S3282XD Multiple fractures of pelvis without disruption of pelvic ring, subsequent encounter for fracture with routine healing: Secondary | ICD-10-CM | POA: Diagnosis not present

## 2017-05-15 DIAGNOSIS — I700 Atherosclerosis of aorta: Secondary | ICD-10-CM | POA: Diagnosis not present

## 2017-05-16 DIAGNOSIS — I700 Atherosclerosis of aorta: Secondary | ICD-10-CM | POA: Diagnosis not present

## 2017-05-16 DIAGNOSIS — I10 Essential (primary) hypertension: Secondary | ICD-10-CM | POA: Diagnosis not present

## 2017-05-16 DIAGNOSIS — F0391 Unspecified dementia with behavioral disturbance: Secondary | ICD-10-CM | POA: Diagnosis not present

## 2017-05-16 DIAGNOSIS — R41841 Cognitive communication deficit: Secondary | ICD-10-CM | POA: Diagnosis not present

## 2017-05-16 DIAGNOSIS — S32502D Unspecified fracture of left pubis, subsequent encounter for fracture with routine healing: Secondary | ICD-10-CM | POA: Diagnosis not present

## 2017-05-16 DIAGNOSIS — S3282XD Multiple fractures of pelvis without disruption of pelvic ring, subsequent encounter for fracture with routine healing: Secondary | ICD-10-CM | POA: Diagnosis not present

## 2017-05-18 DIAGNOSIS — I700 Atherosclerosis of aorta: Secondary | ICD-10-CM | POA: Diagnosis not present

## 2017-05-18 DIAGNOSIS — R41841 Cognitive communication deficit: Secondary | ICD-10-CM | POA: Diagnosis not present

## 2017-05-18 DIAGNOSIS — I10 Essential (primary) hypertension: Secondary | ICD-10-CM | POA: Diagnosis not present

## 2017-05-18 DIAGNOSIS — F0391 Unspecified dementia with behavioral disturbance: Secondary | ICD-10-CM | POA: Diagnosis not present

## 2017-05-18 DIAGNOSIS — S3282XD Multiple fractures of pelvis without disruption of pelvic ring, subsequent encounter for fracture with routine healing: Secondary | ICD-10-CM | POA: Diagnosis not present

## 2017-05-18 DIAGNOSIS — S32502D Unspecified fracture of left pubis, subsequent encounter for fracture with routine healing: Secondary | ICD-10-CM | POA: Diagnosis not present

## 2017-05-21 DIAGNOSIS — R41841 Cognitive communication deficit: Secondary | ICD-10-CM | POA: Diagnosis not present

## 2017-05-21 DIAGNOSIS — S3282XD Multiple fractures of pelvis without disruption of pelvic ring, subsequent encounter for fracture with routine healing: Secondary | ICD-10-CM | POA: Diagnosis not present

## 2017-05-21 DIAGNOSIS — I700 Atherosclerosis of aorta: Secondary | ICD-10-CM | POA: Diagnosis not present

## 2017-05-21 DIAGNOSIS — S32502D Unspecified fracture of left pubis, subsequent encounter for fracture with routine healing: Secondary | ICD-10-CM | POA: Diagnosis not present

## 2017-05-21 DIAGNOSIS — F0391 Unspecified dementia with behavioral disturbance: Secondary | ICD-10-CM | POA: Diagnosis not present

## 2017-05-21 DIAGNOSIS — I10 Essential (primary) hypertension: Secondary | ICD-10-CM | POA: Diagnosis not present

## 2017-05-22 ENCOUNTER — Ambulatory Visit (INDEPENDENT_AMBULATORY_CARE_PROVIDER_SITE_OTHER): Payer: Medicare Other | Admitting: Orthopedic Surgery

## 2017-05-22 ENCOUNTER — Encounter (INDEPENDENT_AMBULATORY_CARE_PROVIDER_SITE_OTHER): Payer: Self-pay | Admitting: Orthopedic Surgery

## 2017-05-22 ENCOUNTER — Ambulatory Visit (INDEPENDENT_AMBULATORY_CARE_PROVIDER_SITE_OTHER): Payer: Medicare Other

## 2017-05-22 VITALS — Ht 68.0 in | Wt 155.0 lb

## 2017-05-22 DIAGNOSIS — S32592D Other specified fracture of left pubis, subsequent encounter for fracture with routine healing: Secondary | ICD-10-CM | POA: Diagnosis not present

## 2017-05-22 NOTE — Progress Notes (Signed)
Office Visit Note   Patient: Brittany Solomon           Date of Birth: 1926/01/13           MRN: [id number] Visit Date: 05/22/2017              Requested by: Cyndi Bender, PA-C [address] [place], [place] [address] PCP: Cyndi Bender, PA-C  Chief Complaint  Patient presents with  . Pelvis - Follow-up    S/p left inferior & superior pubic rami fx / DOI 04/08/17      HPI: Patient is an 84 year old woman who presents in follow-up status post pubic rami fractures on the left.  Patient is weightbearing as tolerated with a walker she has no complaints.  She is currently at [facility] assisted living.  Assessment & Plan: Visit Diagnoses:  1. Closed fracture of ramus of left pubis with routine healing, subsequent encounter     Plan: Continue with activities as tolerated continue with a walker for balance no restrictions.  Follow-Up Instructions: Return if symptoms worsen or fail to improve.   Ortho Exam  Patient is alert, oriented, no adenopathy, well-dressed, normal affect, normal respiratory effort. Examination patient has no pain with range of motion of the hip knee or ankle.  She has no pain with weightbearing.  Patient does have an antalgic gait does have dementia.  Imaging: Xr Pelvis 1-2 Views  Result Date: 05/22/2017 AP of the pelvis shows good healing fracture of the inferior and superior pubic rami fractures on the left.  No femoral neck fractures.  No images are attached to the encounter.  Labs: No results found for: HGBA1C, ESRSEDRATE, CRP, LABURIC, REPTSTATUS, GRAMSTAIN, CULT, LABORGA  @LABSALLVALUES (HGBA1)@  Body mass index is 23.57 kg/m.  Orders:  Orders Placed This Encounter  Procedures  . XR Pelvis 1-2 Views   No orders of the defined types were placed in this encounter.    Procedures: No procedures performed  Clinical Data: No additional findings.  ROS:  All other systems negative, except as noted in the HPI. Review of  Systems  Objective: Vital Signs: Ht 5\' 8"  (1.727 m)   Wt 155 lb (70.3 kg)   BMI 23.57 kg/m   Specialty Comments:  No specialty comments available.  PMFS History: Patient Active Problem List   Diagnosis Date Noted  . Hyponatremia 04/11/2017  . Essential hypertension 04/11/2017  . Hypokalemia 04/11/2017  . Dizziness 04/11/2017  . GERD (gastroesophageal reflux disease) 04/11/2017  . Multiple closed pelvic fractures without disruption of pelvic circle (HCC)   . Pubic ramus fracture ([place]) 04/08/2017  . History of total replacement of right ankle 04/17/2016  . Primary osteoarthritis of both hands 04/10/2016  . Primary osteoarthritis of both knees 04/10/2016  . Primary osteoarthritis of both feet 04/10/2016  . Spondylosis of lumbar region without myelopathy or radiculopathy 04/10/2016  . Age-related osteoporosis without current pathological fracture 04/10/2016  . High risk medication use 04/10/2016  . History of glaucoma 04/10/2016  . History of dementia 04/10/2016  . History of anemia 04/10/2016  . History of hyperlipidemia 04/10/2016  . Primary osteoarthritis of right ankle 04/10/2016  . Rheumatoid arthritis of multiple sites without organ or system involvement with positive rheumatoid factor ([place]) 10/25/2015   Past Medical History:  Diagnosis Date  . Anemia   . Benign neoplasm of colon   . Depression   . Diverticulitis   . Hearing impairment   . Hypercholesteremia   . Hypertension   . Memory loss   .  Osteoporosis   . Rheumatoid arthritis ([place])   . Shingles   . Spinal stenosis     Family History  Problem Relation Age of Onset  . Parkinsonism Mother   . Heart attack Father     Past Surgical History:  Procedure Laterality Date  . ANKLE SURGERY    . BACK SURGERY    . GALLBLADDER SURGERY    . TONSILLECTOMY AND ADENOIDECTOMY    . VAGINAL HYSTERECTOMY     Social History   Occupational History  . Occupation: Retired  Tobacco Use  . Smoking status: Never Smoker   . Smokeless tobacco: Never Used  Substance and Sexual Activity  . Alcohol use: No  . Drug use: No  . Sexual activity: Not on file

## 2017-05-23 DIAGNOSIS — I10 Essential (primary) hypertension: Secondary | ICD-10-CM | POA: Diagnosis not present

## 2017-05-23 DIAGNOSIS — F0391 Unspecified dementia with behavioral disturbance: Secondary | ICD-10-CM | POA: Diagnosis not present

## 2017-05-23 DIAGNOSIS — S3282XD Multiple fractures of pelvis without disruption of pelvic ring, subsequent encounter for fracture with routine healing: Secondary | ICD-10-CM | POA: Diagnosis not present

## 2017-05-23 DIAGNOSIS — S32502D Unspecified fracture of left pubis, subsequent encounter for fracture with routine healing: Secondary | ICD-10-CM | POA: Diagnosis not present

## 2017-05-23 DIAGNOSIS — R41841 Cognitive communication deficit: Secondary | ICD-10-CM | POA: Diagnosis not present

## 2017-05-23 DIAGNOSIS — I700 Atherosclerosis of aorta: Secondary | ICD-10-CM | POA: Diagnosis not present

## 2017-05-24 DIAGNOSIS — S3282XD Multiple fractures of pelvis without disruption of pelvic ring, subsequent encounter for fracture with routine healing: Secondary | ICD-10-CM | POA: Diagnosis not present

## 2017-05-24 DIAGNOSIS — I700 Atherosclerosis of aorta: Secondary | ICD-10-CM | POA: Diagnosis not present

## 2017-05-24 DIAGNOSIS — S32502D Unspecified fracture of left pubis, subsequent encounter for fracture with routine healing: Secondary | ICD-10-CM | POA: Diagnosis not present

## 2017-05-24 DIAGNOSIS — R41841 Cognitive communication deficit: Secondary | ICD-10-CM | POA: Diagnosis not present

## 2017-05-24 DIAGNOSIS — I10 Essential (primary) hypertension: Secondary | ICD-10-CM | POA: Diagnosis not present

## 2017-05-24 DIAGNOSIS — F0391 Unspecified dementia with behavioral disturbance: Secondary | ICD-10-CM | POA: Diagnosis not present

## 2017-05-25 DIAGNOSIS — I700 Atherosclerosis of aorta: Secondary | ICD-10-CM | POA: Diagnosis not present

## 2017-05-25 DIAGNOSIS — R41841 Cognitive communication deficit: Secondary | ICD-10-CM | POA: Diagnosis not present

## 2017-05-25 DIAGNOSIS — S3282XD Multiple fractures of pelvis without disruption of pelvic ring, subsequent encounter for fracture with routine healing: Secondary | ICD-10-CM | POA: Diagnosis not present

## 2017-05-25 DIAGNOSIS — S32502D Unspecified fracture of left pubis, subsequent encounter for fracture with routine healing: Secondary | ICD-10-CM | POA: Diagnosis not present

## 2017-05-25 DIAGNOSIS — I10 Essential (primary) hypertension: Secondary | ICD-10-CM | POA: Diagnosis not present

## 2017-05-25 DIAGNOSIS — F0391 Unspecified dementia with behavioral disturbance: Secondary | ICD-10-CM | POA: Diagnosis not present

## 2017-05-27 DIAGNOSIS — I700 Atherosclerosis of aorta: Secondary | ICD-10-CM | POA: Diagnosis not present

## 2017-05-27 DIAGNOSIS — R41841 Cognitive communication deficit: Secondary | ICD-10-CM | POA: Diagnosis not present

## 2017-05-27 DIAGNOSIS — F0391 Unspecified dementia with behavioral disturbance: Secondary | ICD-10-CM | POA: Diagnosis not present

## 2017-05-27 DIAGNOSIS — S3282XD Multiple fractures of pelvis without disruption of pelvic ring, subsequent encounter for fracture with routine healing: Secondary | ICD-10-CM | POA: Diagnosis not present

## 2017-05-27 DIAGNOSIS — S32502D Unspecified fracture of left pubis, subsequent encounter for fracture with routine healing: Secondary | ICD-10-CM | POA: Diagnosis not present

## 2017-05-27 DIAGNOSIS — I10 Essential (primary) hypertension: Secondary | ICD-10-CM | POA: Diagnosis not present

## 2017-05-29 DIAGNOSIS — S3282XD Multiple fractures of pelvis without disruption of pelvic ring, subsequent encounter for fracture with routine healing: Secondary | ICD-10-CM | POA: Diagnosis not present

## 2017-05-29 DIAGNOSIS — S32502D Unspecified fracture of left pubis, subsequent encounter for fracture with routine healing: Secondary | ICD-10-CM | POA: Diagnosis not present

## 2017-05-29 DIAGNOSIS — I10 Essential (primary) hypertension: Secondary | ICD-10-CM | POA: Diagnosis not present

## 2017-05-29 DIAGNOSIS — F0391 Unspecified dementia with behavioral disturbance: Secondary | ICD-10-CM | POA: Diagnosis not present

## 2017-05-29 DIAGNOSIS — R41841 Cognitive communication deficit: Secondary | ICD-10-CM | POA: Diagnosis not present

## 2017-05-29 DIAGNOSIS — I700 Atherosclerosis of aorta: Secondary | ICD-10-CM | POA: Diagnosis not present

## 2017-05-30 DIAGNOSIS — F0391 Unspecified dementia with behavioral disturbance: Secondary | ICD-10-CM | POA: Diagnosis not present

## 2017-05-30 DIAGNOSIS — S32502D Unspecified fracture of left pubis, subsequent encounter for fracture with routine healing: Secondary | ICD-10-CM | POA: Diagnosis not present

## 2017-05-30 DIAGNOSIS — I10 Essential (primary) hypertension: Secondary | ICD-10-CM | POA: Diagnosis not present

## 2017-05-30 DIAGNOSIS — I700 Atherosclerosis of aorta: Secondary | ICD-10-CM | POA: Diagnosis not present

## 2017-05-30 DIAGNOSIS — R41841 Cognitive communication deficit: Secondary | ICD-10-CM | POA: Diagnosis not present

## 2017-05-30 DIAGNOSIS — S3282XD Multiple fractures of pelvis without disruption of pelvic ring, subsequent encounter for fracture with routine healing: Secondary | ICD-10-CM | POA: Diagnosis not present

## 2017-05-31 DIAGNOSIS — G8929 Other chronic pain: Secondary | ICD-10-CM | POA: Diagnosis not present

## 2017-05-31 DIAGNOSIS — K219 Gastro-esophageal reflux disease without esophagitis: Secondary | ICD-10-CM | POA: Diagnosis not present

## 2017-05-31 DIAGNOSIS — R531 Weakness: Secondary | ICD-10-CM | POA: Diagnosis not present

## 2017-05-31 DIAGNOSIS — E7849 Other hyperlipidemia: Secondary | ICD-10-CM | POA: Diagnosis not present

## 2017-05-31 DIAGNOSIS — I10 Essential (primary) hypertension: Secondary | ICD-10-CM | POA: Diagnosis not present

## 2017-05-31 DIAGNOSIS — H918X3 Other specified hearing loss, bilateral: Secondary | ICD-10-CM | POA: Diagnosis not present

## 2017-05-31 DIAGNOSIS — I2510 Atherosclerotic heart disease of native coronary artery without angina pectoris: Secondary | ICD-10-CM | POA: Diagnosis not present

## 2017-05-31 DIAGNOSIS — R262 Difficulty in walking, not elsewhere classified: Secondary | ICD-10-CM | POA: Diagnosis not present

## 2017-06-01 DIAGNOSIS — F0391 Unspecified dementia with behavioral disturbance: Secondary | ICD-10-CM | POA: Diagnosis not present

## 2017-06-01 DIAGNOSIS — R41841 Cognitive communication deficit: Secondary | ICD-10-CM | POA: Diagnosis not present

## 2017-06-01 DIAGNOSIS — I700 Atherosclerosis of aorta: Secondary | ICD-10-CM | POA: Diagnosis not present

## 2017-06-01 DIAGNOSIS — S32502D Unspecified fracture of left pubis, subsequent encounter for fracture with routine healing: Secondary | ICD-10-CM | POA: Diagnosis not present

## 2017-06-01 DIAGNOSIS — I10 Essential (primary) hypertension: Secondary | ICD-10-CM | POA: Diagnosis not present

## 2017-06-01 DIAGNOSIS — S3282XD Multiple fractures of pelvis without disruption of pelvic ring, subsequent encounter for fracture with routine healing: Secondary | ICD-10-CM | POA: Diagnosis not present

## 2017-06-04 DIAGNOSIS — I700 Atherosclerosis of aorta: Secondary | ICD-10-CM | POA: Diagnosis not present

## 2017-06-04 DIAGNOSIS — R41841 Cognitive communication deficit: Secondary | ICD-10-CM | POA: Diagnosis not present

## 2017-06-04 DIAGNOSIS — F0391 Unspecified dementia with behavioral disturbance: Secondary | ICD-10-CM | POA: Diagnosis not present

## 2017-06-04 DIAGNOSIS — S32502D Unspecified fracture of left pubis, subsequent encounter for fracture with routine healing: Secondary | ICD-10-CM | POA: Diagnosis not present

## 2017-06-04 DIAGNOSIS — S3282XD Multiple fractures of pelvis without disruption of pelvic ring, subsequent encounter for fracture with routine healing: Secondary | ICD-10-CM | POA: Diagnosis not present

## 2017-06-04 DIAGNOSIS — I10 Essential (primary) hypertension: Secondary | ICD-10-CM | POA: Diagnosis not present

## 2017-06-05 DIAGNOSIS — Z111 Encounter for screening for respiratory tuberculosis: Secondary | ICD-10-CM | POA: Diagnosis not present

## 2017-06-05 DIAGNOSIS — R41841 Cognitive communication deficit: Secondary | ICD-10-CM | POA: Diagnosis not present

## 2017-06-05 DIAGNOSIS — K219 Gastro-esophageal reflux disease without esophagitis: Secondary | ICD-10-CM | POA: Diagnosis not present

## 2017-06-05 DIAGNOSIS — Z79899 Other long term (current) drug therapy: Secondary | ICD-10-CM | POA: Diagnosis not present

## 2017-06-05 DIAGNOSIS — I2510 Atherosclerotic heart disease of native coronary artery without angina pectoris: Secondary | ICD-10-CM | POA: Diagnosis not present

## 2017-06-05 DIAGNOSIS — S32502D Unspecified fracture of left pubis, subsequent encounter for fracture with routine healing: Secondary | ICD-10-CM | POA: Diagnosis not present

## 2017-06-05 DIAGNOSIS — R262 Difficulty in walking, not elsewhere classified: Secondary | ICD-10-CM | POA: Diagnosis not present

## 2017-06-05 DIAGNOSIS — G8929 Other chronic pain: Secondary | ICD-10-CM | POA: Diagnosis not present

## 2017-06-05 DIAGNOSIS — I10 Essential (primary) hypertension: Secondary | ICD-10-CM | POA: Diagnosis not present

## 2017-06-05 DIAGNOSIS — F0390 Unspecified dementia without behavioral disturbance: Secondary | ICD-10-CM | POA: Diagnosis not present

## 2017-06-05 DIAGNOSIS — I700 Atherosclerosis of aorta: Secondary | ICD-10-CM | POA: Diagnosis not present

## 2017-06-05 DIAGNOSIS — F0391 Unspecified dementia with behavioral disturbance: Secondary | ICD-10-CM | POA: Diagnosis not present

## 2017-06-05 DIAGNOSIS — S3282XD Multiple fractures of pelvis without disruption of pelvic ring, subsequent encounter for fracture with routine healing: Secondary | ICD-10-CM | POA: Diagnosis not present

## 2017-06-05 DIAGNOSIS — E7849 Other hyperlipidemia: Secondary | ICD-10-CM | POA: Diagnosis not present

## 2017-06-07 DIAGNOSIS — I10 Essential (primary) hypertension: Secondary | ICD-10-CM | POA: Diagnosis not present

## 2017-06-07 DIAGNOSIS — S3282XD Multiple fractures of pelvis without disruption of pelvic ring, subsequent encounter for fracture with routine healing: Secondary | ICD-10-CM | POA: Diagnosis not present

## 2017-06-07 DIAGNOSIS — F0391 Unspecified dementia with behavioral disturbance: Secondary | ICD-10-CM | POA: Diagnosis not present

## 2017-06-07 DIAGNOSIS — S32502D Unspecified fracture of left pubis, subsequent encounter for fracture with routine healing: Secondary | ICD-10-CM | POA: Diagnosis not present

## 2017-06-07 DIAGNOSIS — R41841 Cognitive communication deficit: Secondary | ICD-10-CM | POA: Diagnosis not present

## 2017-06-07 DIAGNOSIS — I700 Atherosclerosis of aorta: Secondary | ICD-10-CM | POA: Diagnosis not present

## 2017-06-08 DIAGNOSIS — S32502D Unspecified fracture of left pubis, subsequent encounter for fracture with routine healing: Secondary | ICD-10-CM | POA: Diagnosis not present

## 2017-06-08 DIAGNOSIS — I10 Essential (primary) hypertension: Secondary | ICD-10-CM | POA: Diagnosis not present

## 2017-06-08 DIAGNOSIS — F0391 Unspecified dementia with behavioral disturbance: Secondary | ICD-10-CM | POA: Diagnosis not present

## 2017-06-08 DIAGNOSIS — S3282XD Multiple fractures of pelvis without disruption of pelvic ring, subsequent encounter for fracture with routine healing: Secondary | ICD-10-CM | POA: Diagnosis not present

## 2017-06-08 DIAGNOSIS — I700 Atherosclerosis of aorta: Secondary | ICD-10-CM | POA: Diagnosis not present

## 2017-06-08 DIAGNOSIS — R41841 Cognitive communication deficit: Secondary | ICD-10-CM | POA: Diagnosis not present

## 2017-06-11 ENCOUNTER — Telehealth: Payer: Self-pay | Admitting: Rheumatology

## 2017-06-11 DIAGNOSIS — S3282XD Multiple fractures of pelvis without disruption of pelvic ring, subsequent encounter for fracture with routine healing: Secondary | ICD-10-CM | POA: Diagnosis not present

## 2017-06-11 DIAGNOSIS — R41841 Cognitive communication deficit: Secondary | ICD-10-CM | POA: Diagnosis not present

## 2017-06-11 DIAGNOSIS — S32502D Unspecified fracture of left pubis, subsequent encounter for fracture with routine healing: Secondary | ICD-10-CM | POA: Diagnosis not present

## 2017-06-11 DIAGNOSIS — I10 Essential (primary) hypertension: Secondary | ICD-10-CM | POA: Diagnosis not present

## 2017-06-11 DIAGNOSIS — F0391 Unspecified dementia with behavioral disturbance: Secondary | ICD-10-CM | POA: Diagnosis not present

## 2017-06-11 DIAGNOSIS — I700 Atherosclerosis of aorta: Secondary | ICD-10-CM | POA: Diagnosis not present

## 2017-06-11 NOTE — Telephone Encounter (Signed)
Please schedule the appointment first.

## 2017-06-11 NOTE — Telephone Encounter (Signed)
Patient's son called stating that his mom had to cancel her ROV appointment with Dr. Estanislado Pandy on 2/26, as well as her IV Infusion Simp[facility] [place] appointment on 2/25 due to having a fall.   Please return his call to let him know whether they need to reschedule the Dr. appointment or the IV infusion first.

## 2017-06-12 NOTE — Telephone Encounter (Signed)
Patient has been scheduled for an office visit on 06/20/17.

## 2017-06-12 NOTE — Progress Notes (Signed)
Office Visit Note  Patient: Brittany Solomon             Date of Birth: 1925-05-07           MRN: [id number]             PCP: Cyndi Bender, PA-C Referring: Cyndi Bender, PA-C Visit Date: 06/20/2017 Occupation: @GUAROCC @    Subjective:  Right ankle pain    History of Present Illness: Brittany Solomon is a 83 y.o. female with history of seropositive rheumatoid arthritis and osteoarthritis.  She continues to take methotrexate 4 tablets weekly and folic acid 1 mg daily.  She missed her last [facility] infusion due to having a pubic ramus fracture on 04/08/2017.  She is following along with Dr. due to.  She continues to get physical therapy and walks with a walker..  She states that since missing her [facility] infusion she has been having increased pain and swelling in her right ankle which was previously replaced at [facility].  She denies any other joint pain or joint swelling.  She says occasionally her right knee will give out on her and she is walking.  She denies any swelling or discomfort in her right knee at this time.  She continues to take Celebrex and tramadol for pain relief.  She reports she has been walking on a daily basis.  Activities of Daily Living:  Patient reports morning stiffness for 0 minutes.   Patient Denies nocturnal pain.  Difficulty dressing/grooming: Denies Difficulty climbing stairs: Reports Difficulty getting out of chair: Reports Difficulty using hands for taps, buttons, cutlery, and/or writing: Denies   Review of Systems  Constitutional: Negative for fatigue.  HENT: Negative for mouth sores, mouth dryness and nose dryness.   Eyes: Positive for dryness. Negative for pain and visual disturbance.  Respiratory: Negative for cough, hemoptysis, shortness of breath and difficulty breathing.   Cardiovascular: Negative for chest pain, palpitations, hypertension and swelling in legs/feet.  Gastrointestinal: Positive for diarrhea. Negative for blood in stool and constipation.    Endocrine: Negative for increased urination.  Genitourinary: Negative for painful urination.  Musculoskeletal: Positive for arthralgias, joint pain and joint swelling. Negative for myalgias, muscle weakness, morning stiffness, muscle tenderness and myalgias.  Skin: Negative for color change, pallor, rash, hair loss, nodules/bumps, skin tightness, ulcers and sensitivity to sunlight.  Allergic/Immunologic: Negative for susceptible to infections.  Neurological: Negative for dizziness, numbness, headaches and weakness.  Hematological: Negative for swollen glands.  Psychiatric/Behavioral: Negative for depressed mood and sleep disturbance. The patient is not nervous/anxious.     PMFS History:  Patient Active Problem List   Diagnosis Date Noted  . Hyponatremia 04/11/2017  . Essential hypertension 04/11/2017  . Hypokalemia 04/11/2017  . Dizziness 04/11/2017  . GERD (gastroesophageal reflux disease) 04/11/2017  . Multiple closed pelvic fractures without disruption of pelvic circle (HCC)   . Pubic ramus fracture ([place]) 04/08/2017  . History of total replacement of right ankle 04/17/2016  . Primary osteoarthritis of both hands 04/10/2016  . Primary osteoarthritis of both knees 04/10/2016  . Primary osteoarthritis of both feet 04/10/2016  . Spondylosis of lumbar region without myelopathy or radiculopathy 04/10/2016  . Age-related osteoporosis without current pathological fracture 04/10/2016  . High risk medication use 04/10/2016  . History of glaucoma 04/10/2016  . History of dementia 04/10/2016  . History of anemia 04/10/2016  . History of hyperlipidemia 04/10/2016  . Primary osteoarthritis of right ankle 04/10/2016  . Rheumatoid arthritis of multiple sites without organ or system  involvement with positive rheumatoid factor ([place]) 10/25/2015    Past Medical History:  Diagnosis Date  . Anemia   . Benign neoplasm of colon   . Depression   . Diverticulitis   . Hearing impairment   .  Hypercholesteremia   . Hypertension   . Memory loss   . Osteoporosis   . Rheumatoid arthritis ([place])   . Shingles   . Spinal stenosis     Family History  Problem Relation Age of Onset  . Parkinsonism Mother   . Heart attack Father    Past Surgical History:  Procedure Laterality Date  . ANKLE SURGERY    . BACK SURGERY    . GALLBLADDER SURGERY    . TONSILLECTOMY AND ADENOIDECTOMY    . VAGINAL HYSTERECTOMY     Social History   Social History Narrative   Patient lives at home alone.   Caffeine Use: none     Objective: Vital Signs: BP (!) 145/66 (BP Location: Left Arm, Patient Position: Sitting, Cuff Size: Small)   Pulse 76   Resp 12   Ht 5\' 4"  (1.626 m)   Wt 134 lb (60.8 kg)   BMI 23.00 kg/m    Physical Exam  Constitutional: She is oriented to person, place, and time. She appears well-developed and well-nourished.  HENT:  Head: Normocephalic and atraumatic.  Eyes: Conjunctivae and EOM are normal.  Neck: Normal range of motion.  Cardiovascular: Normal rate, regular rhythm, normal heart sounds and intact distal pulses.  Pulmonary/Chest: Effort normal and breath sounds normal.  Abdominal: Soft. Bowel sounds are normal.  Lymphadenopathy:    She has no cervical adenopathy.  Neurological: She is alert and oriented to person, place, and time.  Skin: Skin is warm and dry. Capillary refill takes less than 2 seconds.  Psychiatric: She has a normal mood and affect. Her behavior is normal.  Nursing note and vitals reviewed.    Musculoskeletal Exam: C-spine limited range of motion with lateral rotation. Thoracic kyphosis. Unable to full assess lumbar ROM and hip ROM. Shoulder joints good range of motion.  I will joints good range of motion.  She has limited range of motion of bilateral wrists.  She has synovial thickening of bilateral wrist joints. Right 2nd MCP synovial thickening.  Knee joints good ROM with no warmth or effusion.  Warmth and effusion of right ankle.  Limited  ROM of right ankle.    CDAI Exam:  CDAI Homunculus Exam:   Tenderness:  RLE: tibiofemoral and tibiotalar  Swelling:  RLE: tibiotalar  Joint Counts:  CDAI Tender Joint count: 1 CDAI Swollen Joint count: 0  Global Assessments:  Patient Global Assessment: 5 Provider Global Assessment: 5  CDAI Calculated Score: 11    Investigation: No additional findings. CBC Latest Ref Rng & Units 04/12/2017 04/11/2017 04/10/2017  WBC 4.0 - 10.5 K/uL 6.2 6.4 7.5  Hemoglobin 12.0 - 15.0 g/dL 11.8(L) 11.5(L) 11.3(L)  Hematocrit 36.0 - 46.0 % 35.0(L) 34.1(L) 32.2(L)  Platelets 150 - 400 K/uL 133(L) 125(L) 120(L)   CMP Latest Ref Rng & Units 04/12/2017 04/11/2017 04/10/2017  Glucose 65 - 99 mg/dL 94 92 102(H)  BUN 6 - 20 mg/dL 9 9 18   Creatinine 0.44 - 1.00 mg/dL 0.62 0.53 0.63  Sodium 135 - 145 mmol/L 136 136 136  Potassium 3.5 - 5.1 mmol/L 3.7 3.1(L) 3.2(L)  Chloride 101 - 111 mmol/L 100(L) 101 102  CO2 22 - 32 mmol/L 28 26 25   Calcium 8.9 - 10.3 mg/dL 8.4(L) 8.4(L) 8.1(L)  Total Protein 6.5 - 8.1 g/dL - - -  Total Bilirubin 0.3 - 1.2 mg/dL - - -  Alkaline Phos 38 - 126 U/L - - -  AST 15 - 41 U/L - - -  ALT 14 - 54 U/L - - -    Imaging: Xr Pelvis 1-2 Views  Result Date: 05/22/2017 AP of the pelvis shows good healing fracture of the inferior and superior pubic rami fractures on the left.  No femoral neck fractures.   Speciality Comments: Simponi Aria infusions every 8 weeks TB gold negative 04/18/2016    Procedures:  No procedures performed Allergies: Amoxicillin    Assessment / Plan:     Visit Diagnoses: Rheumatoid arthritis of multiple sites without organ or system involvement with positive rheumatoid factor ([place]): She missed her last Simponi Infusion.  She continues to take MTX 4 tablets by mouth once weekly and folic acid 1 mg daily.  She is having increased right ankle pain and swelling.  She previously had a right ankle arthroplasty at [facility].  She has warmth and an effusion on  exam.  She has limited ROM.  She has no other joint pain or joint swelling at this time.  She has no other joints with active synovitis.  She has not scheduled her next Simponi IV infusion yet. She was advised to schedule her next infusion of Simponi IV. She had a left inferior and superior pubic rami fracture on 04/08/17 and was evaluated by Dr. Sharol Given.  She is currently living in [facility] assisted living and undergoing physical therapy. She continues to walk with a walker.   High risk medication use -  Simponi Aria IV,MTX 4 tabs po q wk, Folic acid 1 mg po qd, Celebrex 200 mg po qd -CBC, CMP, and TB gold were ordered today.- Plan: CBC with Differential/Platelet, COMPLETE METABOLIC PANEL WITH GFR, QuantiFERON-TB Gold Plus  Primary osteoarthritis of both hands: She has PIP and DIP synovial thickening of bilateral hands.  Joint protection and muscle strengthening were discussed.   Primary osteoarthritis of both knees: No warmth or effusion of knee joints.  She is walking with a walker.  Her right knee has been giving out on her occasionally.  She is not having any mechanical symptoms.    History of total replacement of right ankle: She has limited ROM and warmth and an effusion of her right ankle.   Primary osteoarthritis of both feet: No discomfort at this time.   Spondylosis of lumbar region without myelopathy or radiculopathy: Chronic pain   Other medical conditions are listed as follows:   History of hyperlipidemia  History of glaucoma  History of dementia  History of anemia    Orders: Orders Placed This Encounter  Procedures  . CBC with Differential/Platelet  . COMPLETE METABOLIC PANEL WITH GFR  . QuantiFERON-TB Gold Plus   No orders of the defined types were placed in this encounter.   Face-to-face time spent with patient was 30 minutes. >50% of time was spent in counseling and coordination of care.  Follow-Up Instructions: Return in about 5 months (around 11/20/2017) for  Rheumatoid arthritis, Osteoarthritis.   Ofilia Neas, PA-C  Note - This record has been created using Dragon software.  Chart creation errors have been sought, but may not always  have been located. Such creation errors do not reflect on  the standard of medical care.

## 2017-06-13 DIAGNOSIS — I10 Essential (primary) hypertension: Secondary | ICD-10-CM | POA: Diagnosis not present

## 2017-06-13 DIAGNOSIS — S3282XD Multiple fractures of pelvis without disruption of pelvic ring, subsequent encounter for fracture with routine healing: Secondary | ICD-10-CM | POA: Diagnosis not present

## 2017-06-13 DIAGNOSIS — R41841 Cognitive communication deficit: Secondary | ICD-10-CM | POA: Diagnosis not present

## 2017-06-13 DIAGNOSIS — F0391 Unspecified dementia with behavioral disturbance: Secondary | ICD-10-CM | POA: Diagnosis not present

## 2017-06-13 DIAGNOSIS — I700 Atherosclerosis of aorta: Secondary | ICD-10-CM | POA: Diagnosis not present

## 2017-06-13 DIAGNOSIS — S32502D Unspecified fracture of left pubis, subsequent encounter for fracture with routine healing: Secondary | ICD-10-CM | POA: Diagnosis not present

## 2017-06-20 ENCOUNTER — Ambulatory Visit (INDEPENDENT_AMBULATORY_CARE_PROVIDER_SITE_OTHER): Payer: Medicare Other | Admitting: Physician Assistant

## 2017-06-20 ENCOUNTER — Encounter: Payer: Self-pay | Admitting: Physician Assistant

## 2017-06-20 VITALS — BP 145/66 | HR 76 | Resp 12 | Ht 64.0 in | Wt 134.0 lb

## 2017-06-20 DIAGNOSIS — M0579 Rheumatoid arthritis with rheumatoid factor of multiple sites without organ or systems involvement: Secondary | ICD-10-CM

## 2017-06-20 DIAGNOSIS — Z8669 Personal history of other diseases of the nervous system and sense organs: Secondary | ICD-10-CM

## 2017-06-20 DIAGNOSIS — Z8659 Personal history of other mental and behavioral disorders: Secondary | ICD-10-CM | POA: Diagnosis not present

## 2017-06-20 DIAGNOSIS — Z96661 Presence of right artificial ankle joint: Secondary | ICD-10-CM

## 2017-06-20 DIAGNOSIS — M170 Bilateral primary osteoarthritis of knee: Secondary | ICD-10-CM | POA: Diagnosis not present

## 2017-06-20 DIAGNOSIS — M47816 Spondylosis without myelopathy or radiculopathy, lumbar region: Secondary | ICD-10-CM

## 2017-06-20 DIAGNOSIS — Z8639 Personal history of other endocrine, nutritional and metabolic disease: Secondary | ICD-10-CM | POA: Diagnosis not present

## 2017-06-20 DIAGNOSIS — M19072 Primary osteoarthritis, left ankle and foot: Secondary | ICD-10-CM

## 2017-06-20 DIAGNOSIS — M19041 Primary osteoarthritis, right hand: Secondary | ICD-10-CM | POA: Diagnosis not present

## 2017-06-20 DIAGNOSIS — Z862 Personal history of diseases of the blood and blood-forming organs and certain disorders involving the immune mechanism: Secondary | ICD-10-CM

## 2017-06-20 DIAGNOSIS — M19042 Primary osteoarthritis, left hand: Secondary | ICD-10-CM | POA: Diagnosis not present

## 2017-06-20 DIAGNOSIS — M19071 Primary osteoarthritis, right ankle and foot: Secondary | ICD-10-CM

## 2017-06-20 DIAGNOSIS — Z79899 Other long term (current) drug therapy: Secondary | ICD-10-CM

## 2017-06-20 LAB — CBC WITH DIFFERENTIAL/PLATELET
BASOS ABS: 91 {cells}/uL (ref 0–200)
Basophils Relative: 1.3 %
Eosinophils Absolute: 315 cells/uL (ref 15–500)
Eosinophils Relative: 4.5 %
HCT: 33.4 % — ABNORMAL LOW (ref 35.0–45.0)
HEMOGLOBIN: 11.3 g/dL — AB (ref 11.7–15.5)
Lymphs Abs: 1988 cells/uL (ref 850–3900)
MCH: 31.5 pg (ref 27.0–33.0)
MCHC: 33.8 g/dL (ref 32.0–36.0)
MCV: 93 fL (ref 80.0–100.0)
MONOS PCT: 12.4 %
MPV: 11.8 fL (ref 7.5–12.5)
NEUTROS ABS: 3738 {cells}/uL (ref 1500–7800)
Neutrophils Relative %: 53.4 %
Platelets: 223 10*3/uL (ref 140–400)
RBC: 3.59 10*6/uL — ABNORMAL LOW (ref 3.80–5.10)
RDW: 12.3 % (ref 11.0–15.0)
TOTAL LYMPHOCYTE: 28.4 %
WBC mixed population: 868 cells/uL (ref 200–950)
WBC: 7 10*3/uL (ref 3.8–10.8)

## 2017-06-21 DIAGNOSIS — R600 Localized edema: Secondary | ICD-10-CM | POA: Diagnosis not present

## 2017-06-21 DIAGNOSIS — K219 Gastro-esophageal reflux disease without esophagitis: Secondary | ICD-10-CM | POA: Diagnosis not present

## 2017-06-21 DIAGNOSIS — R262 Difficulty in walking, not elsewhere classified: Secondary | ICD-10-CM | POA: Diagnosis not present

## 2017-06-21 DIAGNOSIS — F3289 Other specified depressive episodes: Secondary | ICD-10-CM | POA: Diagnosis not present

## 2017-06-21 DIAGNOSIS — I2510 Atherosclerotic heart disease of native coronary artery without angina pectoris: Secondary | ICD-10-CM | POA: Diagnosis not present

## 2017-06-21 DIAGNOSIS — G8929 Other chronic pain: Secondary | ICD-10-CM | POA: Diagnosis not present

## 2017-06-21 DIAGNOSIS — R531 Weakness: Secondary | ICD-10-CM | POA: Diagnosis not present

## 2017-06-21 DIAGNOSIS — I10 Essential (primary) hypertension: Secondary | ICD-10-CM | POA: Diagnosis not present

## 2017-06-21 LAB — COMPLETE METABOLIC PANEL WITH GFR
AG Ratio: 1.7 (calc) (ref 1.0–2.5)
ALKALINE PHOSPHATASE (APISO): 123 U/L (ref 33–130)
ALT: 7 U/L (ref 6–29)
AST: 17 U/L (ref 10–35)
Albumin: 3.8 g/dL (ref 3.6–5.1)
BILIRUBIN TOTAL: 0.4 mg/dL (ref 0.2–1.2)
BUN: 18 mg/dL (ref 7–25)
CHLORIDE: 106 mmol/L (ref 98–110)
CO2: 30 mmol/L (ref 20–32)
CREATININE: 0.64 mg/dL (ref 0.60–0.88)
Calcium: 9.1 mg/dL (ref 8.6–10.4)
GFR, EST AFRICAN AMERICAN: 92 mL/min/{1.73_m2} (ref >=60)
GFR, Est Non African American: 79 mL/min/{1.73_m2} (ref >=60)
Globulin: 2.3 g/dL (calc) (ref 1.9–3.7)
Glucose, Bld: 120 mg/dL — ABNORMAL HIGH (ref 65–99)
Potassium: 4.8 mmol/L (ref 3.5–5.3)
Sodium: 144 mmol/L (ref 135–146)
TOTAL PROTEIN: 6.1 g/dL (ref 6.1–8.1)

## 2017-06-22 LAB — QUANTIFERON-TB GOLD PLUS
Mitogen-NIL: 6.74 IU/mL
NIL: 0.22 IU/mL
QuantiFERON-TB Gold Plus: NEGATIVE

## 2017-06-22 NOTE — Progress Notes (Signed)
TB gold negative.  Glucose mildly elevated. Anemia is stable.

## 2017-06-26 DIAGNOSIS — K219 Gastro-esophageal reflux disease without esophagitis: Secondary | ICD-10-CM | POA: Diagnosis not present

## 2017-06-26 DIAGNOSIS — I2510 Atherosclerotic heart disease of native coronary artery without angina pectoris: Secondary | ICD-10-CM | POA: Diagnosis not present

## 2017-06-26 DIAGNOSIS — E7849 Other hyperlipidemia: Secondary | ICD-10-CM | POA: Diagnosis not present

## 2017-06-26 DIAGNOSIS — I10 Essential (primary) hypertension: Secondary | ICD-10-CM | POA: Diagnosis not present

## 2017-06-26 DIAGNOSIS — F3289 Other specified depressive episodes: Secondary | ICD-10-CM | POA: Diagnosis not present

## 2017-06-26 DIAGNOSIS — F0390 Unspecified dementia without behavioral disturbance: Secondary | ICD-10-CM | POA: Diagnosis not present

## 2017-06-26 DIAGNOSIS — G8929 Other chronic pain: Secondary | ICD-10-CM | POA: Diagnosis not present

## 2017-06-26 DIAGNOSIS — Z111 Encounter for screening for respiratory tuberculosis: Secondary | ICD-10-CM | POA: Diagnosis not present

## 2017-07-06 ENCOUNTER — Other Ambulatory Visit: Payer: Self-pay | Admitting: *Deleted

## 2017-07-06 DIAGNOSIS — M0579 Rheumatoid arthritis with rheumatoid factor of multiple sites without organ or systems involvement: Secondary | ICD-10-CM

## 2017-07-10 ENCOUNTER — Ambulatory Visit (HOSPITAL_COMMUNITY)
Admission: RE | Admit: 2017-07-10 | Discharge: 2017-07-10 | Disposition: A | Discharge status: Home / Self Care | Payer: Medicare Other | Source: Ambulatory Visit | Attending: Rheumatology | Admitting: Rheumatology

## 2017-07-10 DIAGNOSIS — M0579 Rheumatoid arthritis with rheumatoid factor of multiple sites without organ or systems involvement: Secondary | ICD-10-CM

## 2017-07-10 MED ORDER — ACETAMINOPHEN 325 MG PO TABS: 650.0000 mg | ORAL_TABLET | ORAL | Status: DC

## 2017-07-10 MED ORDER — DIPHENHYDRAMINE HCL 25 MG PO CAPS: 25.0000 mg | ORAL_CAPSULE | ORAL | Status: DC

## 2017-07-10 MED ORDER — SODIUM CHLORIDE 0.9 % IV SOLN
2.0000 mg/kg | INTRAVENOUS | Status: DC
  Administered 2017-07-10: 127.5 mg via INTRAVENOUS
  Filled 2017-07-10: qty 10.2

## 2017-07-15 DIAGNOSIS — L603 Nail dystrophy: Secondary | ICD-10-CM | POA: Diagnosis not present

## 2017-07-15 DIAGNOSIS — I739 Peripheral vascular disease, unspecified: Secondary | ICD-10-CM | POA: Diagnosis not present

## 2017-07-15 DIAGNOSIS — L605 Yellow nail syndrome: Secondary | ICD-10-CM | POA: Diagnosis not present

## 2017-07-15 DIAGNOSIS — B351 Tinea unguium: Secondary | ICD-10-CM | POA: Diagnosis not present

## 2017-07-23 DIAGNOSIS — Z79899 Other long term (current) drug therapy: Secondary | ICD-10-CM | POA: Diagnosis not present

## 2017-07-26 DIAGNOSIS — F3289 Other specified depressive episodes: Secondary | ICD-10-CM | POA: Diagnosis not present

## 2017-07-26 DIAGNOSIS — E7849 Other hyperlipidemia: Secondary | ICD-10-CM | POA: Diagnosis not present

## 2017-07-26 DIAGNOSIS — I2510 Atherosclerotic heart disease of native coronary artery without angina pectoris: Secondary | ICD-10-CM | POA: Diagnosis not present

## 2017-07-26 DIAGNOSIS — G8929 Other chronic pain: Secondary | ICD-10-CM | POA: Diagnosis not present

## 2017-07-26 DIAGNOSIS — R531 Weakness: Secondary | ICD-10-CM | POA: Diagnosis not present

## 2017-07-26 DIAGNOSIS — F0390 Unspecified dementia without behavioral disturbance: Secondary | ICD-10-CM | POA: Diagnosis not present

## 2017-07-26 DIAGNOSIS — I10 Essential (primary) hypertension: Secondary | ICD-10-CM | POA: Diagnosis not present

## 2017-07-26 DIAGNOSIS — K219 Gastro-esophageal reflux disease without esophagitis: Secondary | ICD-10-CM | POA: Diagnosis not present

## 2017-07-26 DIAGNOSIS — R262 Difficulty in walking, not elsewhere classified: Secondary | ICD-10-CM | POA: Diagnosis not present

## 2017-08-14 DIAGNOSIS — H409 Unspecified glaucoma: Secondary | ICD-10-CM | POA: Diagnosis not present

## 2017-08-14 DIAGNOSIS — F0390 Unspecified dementia without behavioral disturbance: Secondary | ICD-10-CM | POA: Diagnosis not present

## 2017-08-14 DIAGNOSIS — F339 Major depressive disorder, recurrent, unspecified: Secondary | ICD-10-CM | POA: Diagnosis not present

## 2017-08-14 DIAGNOSIS — M169 Osteoarthritis of hip, unspecified: Secondary | ICD-10-CM | POA: Diagnosis not present

## 2017-09-03 ENCOUNTER — Other Ambulatory Visit: Payer: Self-pay | Admitting: *Deleted

## 2017-09-03 NOTE — Progress Notes (Signed)
Infusion orders are current for patient CBC CMP Tylenol Benadryl appointments are up to date and follow up appointment  is scheduled TB gold not due yet.  

## 2017-09-04 ENCOUNTER — Inpatient Hospital Stay (HOSPITAL_COMMUNITY): Admission: RE | Admit: 2017-09-04 | Payer: Medicare Other | Source: Ambulatory Visit

## 2017-09-05 DIAGNOSIS — H353222 Exudative age-related macular degeneration, left eye, with inactive choroidal neovascularization: Secondary | ICD-10-CM | POA: Diagnosis not present

## 2017-09-05 DIAGNOSIS — H353134 Nonexudative age-related macular degeneration, bilateral, advanced atrophic with subfoveal involvement: Secondary | ICD-10-CM | POA: Diagnosis not present

## 2017-09-06 DIAGNOSIS — K219 Gastro-esophageal reflux disease without esophagitis: Secondary | ICD-10-CM | POA: Diagnosis not present

## 2017-09-06 DIAGNOSIS — I10 Essential (primary) hypertension: Secondary | ICD-10-CM | POA: Diagnosis not present

## 2017-09-06 DIAGNOSIS — F339 Major depressive disorder, recurrent, unspecified: Secondary | ICD-10-CM | POA: Diagnosis not present

## 2017-09-06 DIAGNOSIS — F0390 Unspecified dementia without behavioral disturbance: Secondary | ICD-10-CM | POA: Diagnosis not present

## 2017-09-11 ENCOUNTER — Ambulatory Visit (HOSPITAL_COMMUNITY)
Admission: RE | Admit: 2017-09-11 | Discharge: 2017-09-11 | Disposition: A | Discharge status: Home / Self Care | Payer: Medicare Other | Source: Ambulatory Visit | Attending: Rheumatology | Admitting: Rheumatology

## 2017-09-11 DIAGNOSIS — M0579 Rheumatoid arthritis with rheumatoid factor of multiple sites without organ or systems involvement: Secondary | ICD-10-CM | POA: Diagnosis not present

## 2017-09-11 LAB — CBC
HCT: 33.2 % — ABNORMAL LOW (ref 36.0–46.0)
HEMOGLOBIN: 10.5 g/dL — AB (ref 12.0–15.0)
MCH: 27.8 pg (ref 26.0–34.0)
MCHC: 31.6 g/dL (ref 30.0–36.0)
MCV: 87.8 fL (ref 78.0–100.0)
Platelets: 225 10*3/uL (ref 150–400)
RBC: 3.78 MIL/uL — ABNORMAL LOW (ref 3.87–5.11)
RDW: 14.6 % (ref 11.5–15.5)
WBC: 7 10*3/uL (ref 4.0–10.5)

## 2017-09-11 LAB — COMPREHENSIVE METABOLIC PANEL
ALK PHOS: 85 U/L (ref 38–126)
ALT: 12 U/L (ref 0–44)
AST: 21 U/L (ref 15–41)
Albumin: 2.9 g/dL — ABNORMAL LOW (ref 3.5–5.0)
Anion gap: 11 (ref 5–15)
BUN: 14 mg/dL (ref 8–23)
CHLORIDE: 102 mmol/L (ref 98–111)
CO2: 27 mmol/L (ref 22–32)
CREATININE: 0.58 mg/dL (ref 0.44–1.00)
Calcium: 9.1 mg/dL (ref 8.9–10.3)
GFR calc non Af Amer: >60 mL/min (ref >60)
GLUCOSE: 86 mg/dL (ref 70–99)
Potassium: 3.4 mmol/L — ABNORMAL LOW (ref 3.5–5.1)
SODIUM: 140 mmol/L (ref 135–145)
Total Bilirubin: 0.5 mg/dL (ref 0.3–1.2)
Total Protein: 6.2 g/dL — ABNORMAL LOW (ref 6.5–8.1)

## 2017-09-11 MED ORDER — SODIUM CHLORIDE 0.9 % IV SOLN
2.0000 mg/kg | INTRAVENOUS | Status: DC
  Administered 2017-09-11: 115 mg via INTRAVENOUS
  Filled 2017-09-11: qty 9.2

## 2017-09-11 MED ORDER — DIPHENHYDRAMINE HCL 25 MG PO CAPS
ORAL_CAPSULE | ORAL | Status: AC
  Administered 2017-09-11: 25 mg
  Filled 2017-09-11: qty 1

## 2017-09-11 MED ORDER — ACETAMINOPHEN 325 MG PO TABS
650.0000 mg | ORAL_TABLET | ORAL | Status: DC
  Administered 2017-09-11: 650 mg via ORAL

## 2017-09-11 MED ORDER — DIPHENHYDRAMINE HCL 25 MG PO CAPS: 25.0000 mg | ORAL_CAPSULE | ORAL | Status: DC

## 2017-09-11 MED ORDER — ACETAMINOPHEN 325 MG PO TABS
ORAL_TABLET | ORAL | Status: AC
  Administered 2017-09-11: 650 mg via ORAL
  Filled 2017-09-11: qty 2

## 2017-09-11 NOTE — Progress Notes (Signed)
Patient is anemic and potassium is low.  Please notify patient and fax the results to her PCP.

## 2017-09-16 DIAGNOSIS — L601 Onycholysis: Secondary | ICD-10-CM | POA: Diagnosis not present

## 2017-09-16 DIAGNOSIS — B351 Tinea unguium: Secondary | ICD-10-CM | POA: Diagnosis not present

## 2017-09-16 DIAGNOSIS — I739 Peripheral vascular disease, unspecified: Secondary | ICD-10-CM | POA: Diagnosis not present

## 2017-09-20 DIAGNOSIS — F0390 Unspecified dementia without behavioral disturbance: Secondary | ICD-10-CM | POA: Diagnosis not present

## 2017-09-20 DIAGNOSIS — F339 Major depressive disorder, recurrent, unspecified: Secondary | ICD-10-CM | POA: Diagnosis not present

## 2017-09-20 DIAGNOSIS — I10 Essential (primary) hypertension: Secondary | ICD-10-CM | POA: Diagnosis not present

## 2017-09-20 DIAGNOSIS — K219 Gastro-esophageal reflux disease without esophagitis: Secondary | ICD-10-CM | POA: Diagnosis not present

## 2017-10-16 ENCOUNTER — Other Ambulatory Visit: Payer: Self-pay | Admitting: *Deleted

## 2017-10-16 DIAGNOSIS — R451 Restlessness and agitation: Secondary | ICD-10-CM | POA: Diagnosis not present

## 2017-10-16 DIAGNOSIS — F339 Major depressive disorder, recurrent, unspecified: Secondary | ICD-10-CM | POA: Diagnosis not present

## 2017-10-16 DIAGNOSIS — F0390 Unspecified dementia without behavioral disturbance: Secondary | ICD-10-CM | POA: Diagnosis not present

## 2017-10-16 DIAGNOSIS — I10 Essential (primary) hypertension: Secondary | ICD-10-CM | POA: Diagnosis not present

## 2017-10-16 NOTE — Progress Notes (Signed)
Infusion orders are current for patient CBC CMP Tylenol Benadryl appointments are up to date and follow up appointment  is scheduled TB gold not due yet.  

## 2017-11-06 ENCOUNTER — Ambulatory Visit (HOSPITAL_COMMUNITY)
Admission: RE | Admit: 2017-11-06 | Discharge: 2017-11-06 | Disposition: A | Discharge status: Home / Self Care | Payer: Medicare Other | Source: Ambulatory Visit | Attending: Rheumatology | Admitting: Rheumatology

## 2017-11-06 DIAGNOSIS — M0579 Rheumatoid arthritis with rheumatoid factor of multiple sites without organ or systems involvement: Secondary | ICD-10-CM | POA: Diagnosis not present

## 2017-11-06 LAB — COMPREHENSIVE METABOLIC PANEL
ALT: 9 U/L (ref 0–44)
AST: 23 U/L (ref 15–41)
Albumin: 3.1 g/dL — ABNORMAL LOW (ref 3.5–5.0)
Alkaline Phosphatase: 80 U/L (ref 38–126)
Anion gap: 10 (ref 5–15)
BILIRUBIN TOTAL: 0.6 mg/dL (ref 0.3–1.2)
BUN: 17 mg/dL (ref 8–23)
CHLORIDE: 103 mmol/L (ref 98–111)
CO2: 29 mmol/L (ref 22–32)
CREATININE: 0.79 mg/dL (ref 0.44–1.00)
Calcium: 8.8 mg/dL — ABNORMAL LOW (ref 8.9–10.3)
GFR calc Af Amer: >60 mL/min (ref >60)
GLUCOSE: 143 mg/dL — AB (ref 70–99)
Potassium: 3.5 mmol/L (ref 3.5–5.1)
Sodium: 142 mmol/L (ref 135–145)
Total Protein: 6.2 g/dL — ABNORMAL LOW (ref 6.5–8.1)

## 2017-11-06 LAB — CBC
HCT: 37.6 % (ref 36.0–46.0)
Hemoglobin: 11.7 g/dL — ABNORMAL LOW (ref 12.0–15.0)
MCH: 28.1 pg (ref 26.0–34.0)
MCHC: 31.1 g/dL (ref 30.0–36.0)
MCV: 90.4 fL (ref 78.0–100.0)
Platelets: 190 K/uL (ref 150–400)
RBC: 4.16 MIL/uL (ref 3.87–5.11)
RDW: 16.8 % — ABNORMAL HIGH (ref 11.5–15.5)
WBC: 6.6 K/uL (ref 4.0–10.5)

## 2017-11-06 MED ORDER — ACETAMINOPHEN 325 MG PO TABS
650.0000 mg | ORAL_TABLET | ORAL | Status: DC
  Administered 2017-11-06: 650 mg via ORAL

## 2017-11-06 MED ORDER — SODIUM CHLORIDE 0.9 % IV SOLN
2.0000 mg/kg | INTRAVENOUS | Status: AC
  Administered 2017-11-06: 115 mg via INTRAVENOUS
  Filled 2017-11-06: qty 9.2

## 2017-11-06 MED ORDER — DIPHENHYDRAMINE HCL 25 MG PO CAPS
25.0000 mg | ORAL_CAPSULE | ORAL | Status: DC
  Administered 2017-11-06: 25 mg via ORAL

## 2017-11-06 MED ORDER — ACETAMINOPHEN 325 MG PO TABS
ORAL_TABLET | ORAL | Status: AC
  Filled 2017-11-06: qty 2

## 2017-11-06 MED ORDER — DIPHENHYDRAMINE HCL 25 MG PO CAPS
ORAL_CAPSULE | ORAL | Status: AC
  Filled 2017-11-06: qty 1

## 2017-11-06 NOTE — Progress Notes (Signed)
Calcium is low.  Please advise patient to take calcium supplement total 1200 mg p.o. daily including her diet.

## 2017-11-07 NOTE — Progress Notes (Deleted)
Office Visit Note  Patient: Brittany Solomon             Date of Birth: 25-Sep-1925           MRN: [id number]             PCP: Cyndi Bender, PA-C Referring: Cyndi Bender, PA-C Visit Date: 11/21/2017 Occupation: @GUAROCC @  Subjective:  No chief complaint on file.   History of Present Illness: Brittany Solomon is a 84 y.o. female ***   Activities of Daily Living:  Patient reports morning stiffness for *** {minute/hour:19697}.   Patient {ACTIONS;DENIES/REPORTS:21021675::"Denies"} nocturnal pain.  Difficulty dressing/grooming: {ACTIONS;DENIES/REPORTS:21021675::"Denies"} Difficulty climbing stairs: {ACTIONS;DENIES/REPORTS:21021675::"Denies"} Difficulty getting out of chair: {ACTIONS;DENIES/REPORTS:21021675::"Denies"} Difficulty using hands for taps, buttons, cutlery, and/or writing: {ACTIONS;DENIES/REPORTS:21021675::"Denies"}  No Rheumatology ROS completed.   PMFS History:  Patient Active Problem List   Diagnosis Date Noted  . Hyponatremia 04/11/2017  . Essential hypertension 04/11/2017  . Hypokalemia 04/11/2017  . Dizziness 04/11/2017  . GERD (gastroesophageal reflux disease) 04/11/2017  . Multiple closed pelvic fractures without disruption of pelvic circle (HCC)   . Pubic ramus fracture ([place]) 04/08/2017  . History of total replacement of right ankle 04/17/2016  . Primary osteoarthritis of both hands 04/10/2016  . Primary osteoarthritis of both knees 04/10/2016  . Primary osteoarthritis of both feet 04/10/2016  . Spondylosis of lumbar region without myelopathy or radiculopathy 04/10/2016  . Age-related osteoporosis without current pathological fracture 04/10/2016  . High risk medication use 04/10/2016  . History of glaucoma 04/10/2016  . History of dementia 04/10/2016  . History of anemia 04/10/2016  . History of hyperlipidemia 04/10/2016  . Primary osteoarthritis of right ankle 04/10/2016  . Rheumatoid arthritis of multiple sites without organ or system involvement with  positive rheumatoid factor ([place]) 10/25/2015    Past Medical History:  Diagnosis Date  . Anemia   . Benign neoplasm of colon   . Depression   . Diverticulitis   . Hearing impairment   . Hypercholesteremia   . Hypertension   . Memory loss   . Osteoporosis   . Rheumatoid arthritis ([place])   . Shingles   . Spinal stenosis     Family History  Problem Relation Age of Onset  . Parkinsonism Mother   . Heart attack Father    Past Surgical History:  Procedure Laterality Date  . ANKLE SURGERY    . BACK SURGERY    . GALLBLADDER SURGERY    . TONSILLECTOMY AND ADENOIDECTOMY    . VAGINAL HYSTERECTOMY     Social History   Social History Narrative   Patient lives at home alone.   Caffeine Use: none    Objective: Vital Signs: There were no vitals taken for this visit.   Physical Exam   Musculoskeletal Exam: ***  CDAI Exam: CDAI Score: Not documented Patient Global Assessment: Not documented; Provider Global Assessment: Not documented Swollen: Not documented; Tender: Not documented Joint Exam   Not documented   There is currently no information documented on the homunculus. Go to the Rheumatology activity and complete the homunculus joint exam.  Investigation: No additional findings.  Imaging: No results found.  Recent Labs: Lab Results  Component Value Date   WBC 6.6 11/06/2017   HGB 11.7 (L) 11/06/2017   PLT 190 11/06/2017   NA 142 11/06/2017   K 3.5 11/06/2017   CL 103 11/06/2017   CO2 29 11/06/2017   GLUCOSE 143 (H) 11/06/2017   BUN 17 11/06/2017   CREATININE 0.79 11/06/2017   BILITOT  0.6 11/06/2017   ALKPHOS 80 11/06/2017   AST 23 11/06/2017   ALT 9 11/06/2017   PROT 6.2 (L) 11/06/2017   ALBUMIN 3.1 (L) 11/06/2017   CALCIUM 8.8 (L) 11/06/2017   GFRAA >60 11/06/2017   QFTBGOLD Negative 03/22/2015   QFTBGOLDPLUS NEGATIVE 06/20/2017    Speciality Comments: Simponi Aria infusions every 8 weeks TB gold negative 04/18/2016  Procedures:  No procedures  performed Allergies: Amoxicillin   Assessment / Plan:     Visit Diagnoses: No diagnosis found.   Orders: No orders of the defined types were placed in this encounter.  No orders of the defined types were placed in this encounter.   Face-to-face time spent with patient was *** minutes. Greater than 50% of time was spent in counseling and coordination of care.  Follow-Up Instructions: No follow-ups on file.   Earnestine Mealing, CMA  Note - This record has been created using Editor, commissioning.  Chart creation errors have been sought, but may not always  have been located. Such creation errors do not reflect on  the standard of medical care.

## 2017-11-20 DIAGNOSIS — F339 Major depressive disorder, recurrent, unspecified: Secondary | ICD-10-CM | POA: Diagnosis not present

## 2017-11-20 DIAGNOSIS — F0390 Unspecified dementia without behavioral disturbance: Secondary | ICD-10-CM | POA: Diagnosis not present

## 2017-11-20 DIAGNOSIS — H409 Unspecified glaucoma: Secondary | ICD-10-CM | POA: Diagnosis not present

## 2017-11-20 DIAGNOSIS — M169 Osteoarthritis of hip, unspecified: Secondary | ICD-10-CM | POA: Diagnosis not present

## 2017-11-21 ENCOUNTER — Ambulatory Visit: Payer: Medicare Other | Admitting: Physician Assistant

## 2017-12-02 DIAGNOSIS — B351 Tinea unguium: Secondary | ICD-10-CM | POA: Diagnosis not present

## 2017-12-02 DIAGNOSIS — I739 Peripheral vascular disease, unspecified: Secondary | ICD-10-CM | POA: Diagnosis not present

## 2017-12-10 ENCOUNTER — Telehealth: Payer: Self-pay | Admitting: *Deleted

## 2017-12-10 ENCOUNTER — Other Ambulatory Visit: Payer: Self-pay | Admitting: *Deleted

## 2017-12-10 DIAGNOSIS — M0579 Rheumatoid arthritis with rheumatoid factor of multiple sites without organ or systems involvement: Secondary | ICD-10-CM

## 2017-12-10 NOTE — Telephone Encounter (Signed)
Please schedule patient a follow up visit. Patient due October 2019. Thanks!Marland Kitchen

## 2017-12-13 NOTE — Telephone Encounter (Signed)
Patient's son called stating he will call back in November to reschedule her appointment.

## 2018-01-01 ENCOUNTER — Ambulatory Visit (HOSPITAL_COMMUNITY)
Admission: RE | Admit: 2018-01-01 | Discharge: 2018-01-01 | Disposition: A | Discharge status: Home / Self Care | Payer: Medicare Other | Source: Ambulatory Visit | Attending: Rheumatology | Admitting: Rheumatology

## 2018-01-01 DIAGNOSIS — M0579 Rheumatoid arthritis with rheumatoid factor of multiple sites without organ or systems involvement: Secondary | ICD-10-CM | POA: Diagnosis not present

## 2018-01-01 LAB — CBC
HCT: 40 % (ref 36.0–46.0)
Hemoglobin: 12.2 g/dL (ref 12.0–15.0)
MCH: 28.2 pg (ref 26.0–34.0)
MCHC: 30.5 g/dL (ref 30.0–36.0)
MCV: 92.6 fL (ref 80.0–100.0)
Platelets: 176 10*3/uL (ref 150–400)
RBC: 4.32 MIL/uL (ref 3.87–5.11)
RDW: 15.5 % (ref 11.5–15.5)
WBC: 6.3 10*3/uL (ref 4.0–10.5)
nRBC: 0 % (ref 0.0–0.2)

## 2018-01-01 LAB — COMPREHENSIVE METABOLIC PANEL
ALT: 11 U/L (ref 0–44)
AST: 22 U/L (ref 15–41)
Albumin: 3.3 g/dL — ABNORMAL LOW (ref 3.5–5.0)
Alkaline Phosphatase: 86 U/L (ref 38–126)
Anion gap: 5 (ref 5–15)
BUN: 15 mg/dL (ref 8–23)
CHLORIDE: 104 mmol/L (ref 98–111)
CO2: 30 mmol/L (ref 22–32)
CREATININE: 0.65 mg/dL (ref 0.44–1.00)
Calcium: 8.9 mg/dL (ref 8.9–10.3)
Glucose, Bld: 91 mg/dL (ref 70–99)
Potassium: 3.8 mmol/L (ref 3.5–5.1)
Sodium: 139 mmol/L (ref 135–145)
Total Bilirubin: 0.7 mg/dL (ref 0.3–1.2)
Total Protein: 6.1 g/dL — ABNORMAL LOW (ref 6.5–8.1)

## 2018-01-01 MED ORDER — DIPHENHYDRAMINE HCL 25 MG PO CAPS: 25.0000 mg | ORAL_CAPSULE | ORAL | Status: DC

## 2018-01-01 MED ORDER — ACETAMINOPHEN 325 MG PO TABS: 650.0000 mg | ORAL_TABLET | ORAL | Status: DC

## 2018-01-01 MED ORDER — SODIUM CHLORIDE 0.9 % IV SOLN
2.0000 mg/kg | INTRAVENOUS | Status: DC
  Administered 2018-01-01: 112.5 mg via INTRAVENOUS
  Filled 2018-01-01: qty 9

## 2018-01-08 DIAGNOSIS — K219 Gastro-esophageal reflux disease without esophagitis: Secondary | ICD-10-CM | POA: Diagnosis not present

## 2018-01-08 DIAGNOSIS — M169 Osteoarthritis of hip, unspecified: Secondary | ICD-10-CM | POA: Diagnosis not present

## 2018-01-08 DIAGNOSIS — F339 Major depressive disorder, recurrent, unspecified: Secondary | ICD-10-CM | POA: Diagnosis not present

## 2018-01-08 DIAGNOSIS — F0390 Unspecified dementia without behavioral disturbance: Secondary | ICD-10-CM | POA: Diagnosis not present

## 2018-01-09 DIAGNOSIS — R451 Restlessness and agitation: Secondary | ICD-10-CM | POA: Diagnosis not present

## 2018-01-15 DIAGNOSIS — N390 Urinary tract infection, site not specified: Secondary | ICD-10-CM | POA: Diagnosis not present

## 2018-01-15 DIAGNOSIS — F0390 Unspecified dementia without behavioral disturbance: Secondary | ICD-10-CM | POA: Diagnosis not present

## 2018-01-15 DIAGNOSIS — K219 Gastro-esophageal reflux disease without esophagitis: Secondary | ICD-10-CM | POA: Diagnosis not present

## 2018-01-15 DIAGNOSIS — F339 Major depressive disorder, recurrent, unspecified: Secondary | ICD-10-CM | POA: Diagnosis not present

## 2018-01-22 DIAGNOSIS — F339 Major depressive disorder, recurrent, unspecified: Secondary | ICD-10-CM | POA: Diagnosis not present

## 2018-01-22 DIAGNOSIS — R451 Restlessness and agitation: Secondary | ICD-10-CM | POA: Diagnosis not present

## 2018-01-22 DIAGNOSIS — I10 Essential (primary) hypertension: Secondary | ICD-10-CM | POA: Diagnosis not present

## 2018-01-22 DIAGNOSIS — K219 Gastro-esophageal reflux disease without esophagitis: Secondary | ICD-10-CM | POA: Diagnosis not present

## 2018-01-29 DIAGNOSIS — Z79899 Other long term (current) drug therapy: Secondary | ICD-10-CM | POA: Diagnosis not present

## 2018-01-30 ENCOUNTER — Telehealth: Payer: Self-pay | Admitting: Pharmacy Technician

## 2018-01-30 NOTE — Telephone Encounter (Signed)
Called pt to verify benefits for 2020.Pt currently received infusions that may require a pre-certification.  Spoke with her son Ritchie who states she will continue to have Medicare and [employer] Supplement. Neither plan will require a pre-cert.

## 2018-02-12 DIAGNOSIS — F339 Major depressive disorder, recurrent, unspecified: Secondary | ICD-10-CM | POA: Diagnosis not present

## 2018-02-12 DIAGNOSIS — R2689 Other abnormalities of gait and mobility: Secondary | ICD-10-CM | POA: Diagnosis not present

## 2018-02-12 DIAGNOSIS — K219 Gastro-esophageal reflux disease without esophagitis: Secondary | ICD-10-CM | POA: Diagnosis not present

## 2018-02-12 DIAGNOSIS — F0390 Unspecified dementia without behavioral disturbance: Secondary | ICD-10-CM | POA: Diagnosis not present

## 2018-02-25 ENCOUNTER — Other Ambulatory Visit: Payer: Self-pay | Admitting: *Deleted

## 2018-02-25 NOTE — Progress Notes (Signed)
Infusion orders are current for patient CBC CMP Tylenol Benadryl appointments are up to date and follow up appointment  is scheduled TB gold not due yet.  

## 2018-02-26 ENCOUNTER — Ambulatory Visit (HOSPITAL_COMMUNITY)
Admission: RE | Admit: 2018-02-26 | Discharge: 2018-02-26 | Disposition: A | Discharge status: Home / Self Care | Payer: Medicare Other | Source: Ambulatory Visit | Attending: Rheumatology | Admitting: Rheumatology

## 2018-02-26 DIAGNOSIS — M0579 Rheumatoid arthritis with rheumatoid factor of multiple sites without organ or systems involvement: Secondary | ICD-10-CM | POA: Insufficient documentation

## 2018-02-26 LAB — COMPREHENSIVE METABOLIC PANEL
ALT: 8 U/L (ref 0–44)
AST: 24 U/L (ref 15–41)
Albumin: 3.4 g/dL — ABNORMAL LOW (ref 3.5–5.0)
Alkaline Phosphatase: 82 U/L (ref 38–126)
Anion gap: 10 (ref 5–15)
BUN: 13 mg/dL (ref 8–23)
CHLORIDE: 102 mmol/L (ref 98–111)
CO2: 28 mmol/L (ref 22–32)
CREATININE: 0.73 mg/dL (ref 0.44–1.00)
Calcium: 8.9 mg/dL (ref 8.9–10.3)
GFR calc Af Amer: >60 mL/min (ref >60)
GFR calc non Af Amer: >60 mL/min (ref >60)
Glucose, Bld: 97 mg/dL (ref 70–99)
Potassium: 3.7 mmol/L (ref 3.5–5.1)
SODIUM: 140 mmol/L (ref 135–145)
Total Bilirubin: 0.6 mg/dL (ref 0.3–1.2)
Total Protein: 6.3 g/dL — ABNORMAL LOW (ref 6.5–8.1)

## 2018-02-26 LAB — CBC
HCT: 39.7 % (ref 36.0–46.0)
Hemoglobin: 13.3 g/dL (ref 12.0–15.0)
MCH: 31.2 pg (ref 26.0–34.0)
MCHC: 33.5 g/dL (ref 30.0–36.0)
MCV: 93.2 fL (ref 80.0–100.0)
Platelets: 184 10*3/uL (ref 150–400)
RBC: 4.26 MIL/uL (ref 3.87–5.11)
RDW: 14.7 % (ref 11.5–15.5)
WBC: 6.3 10*3/uL (ref 4.0–10.5)
nRBC: 0 % (ref 0.0–0.2)

## 2018-02-26 MED ORDER — ACETAMINOPHEN 325 MG PO TABS: 650.0000 mg | ORAL_TABLET | ORAL | Status: DC

## 2018-02-26 MED ORDER — SODIUM CHLORIDE 0.9 % IV SOLN
2.0000 mg/kg | INTRAVENOUS | Status: DC
  Administered 2018-02-26: 111.25 mg via INTRAVENOUS
  Filled 2018-02-26: qty 8.9

## 2018-02-26 MED ORDER — DIPHENHYDRAMINE HCL 25 MG PO CAPS: 25.0000 mg | ORAL_CAPSULE | ORAL | Status: DC

## 2018-02-26 NOTE — Progress Notes (Signed)
Labs are stable.

## 2018-04-15 ENCOUNTER — Other Ambulatory Visit: Payer: Self-pay | Admitting: *Deleted

## 2018-04-15 NOTE — Progress Notes (Signed)
Infusion orders are current for patient CBC CMP Tylenol Benadryl appointments are up to date and follow up appointment  is scheduled TB gold not due yet.  

## 2018-04-23 ENCOUNTER — Ambulatory Visit (HOSPITAL_COMMUNITY)
Admission: RE | Admit: 2018-04-23 | Discharge: 2018-04-23 | Disposition: A | Discharge status: Home / Self Care | Payer: Medicare Other | Source: Ambulatory Visit | Attending: Rheumatology | Admitting: Rheumatology

## 2018-04-23 DIAGNOSIS — M0579 Rheumatoid arthritis with rheumatoid factor of multiple sites without organ or systems involvement: Secondary | ICD-10-CM | POA: Diagnosis present

## 2018-04-23 LAB — COMPREHENSIVE METABOLIC PANEL
ALT: 10 U/L (ref 0–44)
AST: 19 U/L (ref 15–41)
Albumin: 3.2 g/dL — ABNORMAL LOW (ref 3.5–5.0)
Alkaline Phosphatase: 79 U/L (ref 38–126)
Anion gap: 3 — ABNORMAL LOW (ref 5–15)
BILIRUBIN TOTAL: 0.8 mg/dL (ref 0.3–1.2)
BUN: 14 mg/dL (ref 8–23)
CO2: 30 mmol/L (ref 22–32)
Calcium: 8.8 mg/dL — ABNORMAL LOW (ref 8.9–10.3)
Chloride: 105 mmol/L (ref 98–111)
Creatinine, Ser: 0.72 mg/dL (ref 0.44–1.00)
GFR calc non Af Amer: >60 mL/min (ref >60)
Glucose, Bld: 85 mg/dL (ref 70–99)
Potassium: 3.7 mmol/L (ref 3.5–5.1)
Sodium: 138 mmol/L (ref 135–145)
TOTAL PROTEIN: 5.8 g/dL — AB (ref 6.5–8.1)

## 2018-04-23 LAB — CBC
HCT: 36.6 % (ref 36.0–46.0)
Hemoglobin: 11.5 g/dL — ABNORMAL LOW (ref 12.0–15.0)
MCH: 30.3 pg (ref 26.0–34.0)
MCHC: 31.4 g/dL (ref 30.0–36.0)
MCV: 96.3 fL (ref 80.0–100.0)
PLATELETS: 158 10*3/uL (ref 150–400)
RBC: 3.8 MIL/uL — ABNORMAL LOW (ref 3.87–5.11)
RDW: 13.9 % (ref 11.5–15.5)
WBC: 6.6 10*3/uL (ref 4.0–10.5)
nRBC: 0 % (ref 0.0–0.2)

## 2018-04-23 MED ORDER — SODIUM CHLORIDE 0.9 % IV SOLN
2.0000 mg/kg | INTRAVENOUS | Status: DC
  Administered 2018-04-23: 115 mg via INTRAVENOUS
  Filled 2018-04-23: qty 9.2

## 2018-04-23 MED ORDER — DIPHENHYDRAMINE HCL 25 MG PO CAPS: 25.0000 mg | ORAL_CAPSULE | ORAL | Status: DC

## 2018-04-23 MED ORDER — ACETAMINOPHEN 325 MG PO TABS: 650.0000 mg | ORAL_TABLET | ORAL | Status: DC

## 2018-04-23 NOTE — Progress Notes (Signed)
Anemia noted.  Please forward labs to her PCP.

## 2018-06-05 ENCOUNTER — Other Ambulatory Visit: Payer: Self-pay | Admitting: *Deleted

## 2018-06-05 NOTE — Progress Notes (Signed)
Infusion orders are current for patient CBC CMP Tylenol Benadryl appointments are up to date and follow up appointment  is scheduled TB gold not due yet.  

## 2018-06-18 ENCOUNTER — Encounter (HOSPITAL_COMMUNITY): Payer: Medicare Other

## 2018-08-19 ENCOUNTER — Telehealth: Payer: Self-pay | Admitting: Rheumatology

## 2018-08-19 NOTE — Telephone Encounter (Signed)
Patient's son Ritchie called to let Dr. Estanislado Pandy know that his mom passed away last week.

## 2018-08-21 DEATH — deceased

## 2019-09-08 ENCOUNTER — Encounter (INDEPENDENT_AMBULATORY_CARE_PROVIDER_SITE_OTHER): Payer: Medicare Other | Admitting: Ophthalmology

## 2019-11-22 IMAGING — CT CT PELVIS W/O CM
2 of 3 series · 16 of 46 positions shown, 18 images · non-contrast
Comparison: Left hip x-rays from same day.

CLINICAL DATA: Left hip pain after fall. Pubic rami fractures seen
on x-ray.

EXAM:
CT PELVIS WITHOUT CONTRAST
TECHNIQUE: Multidetector CT imaging of the pelvis was performed following the
standard protocol without intravenous contrast.

[Series 3: axial st · axial · 0.82mm/px · z∈[+397,+679]mm · 13 of 163 slices shown, 15 images]
[im 11/163  soft-tissue]
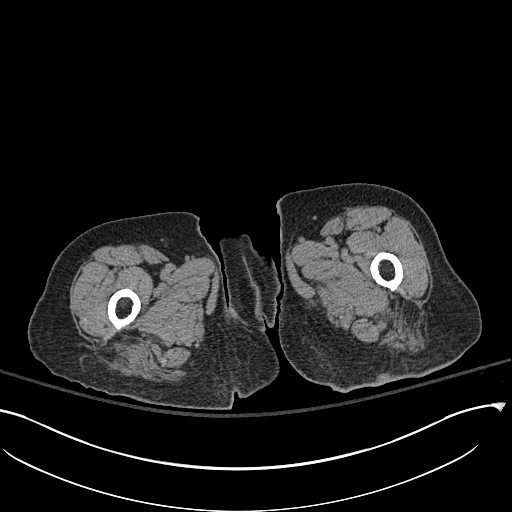
[im 11/163  bone]
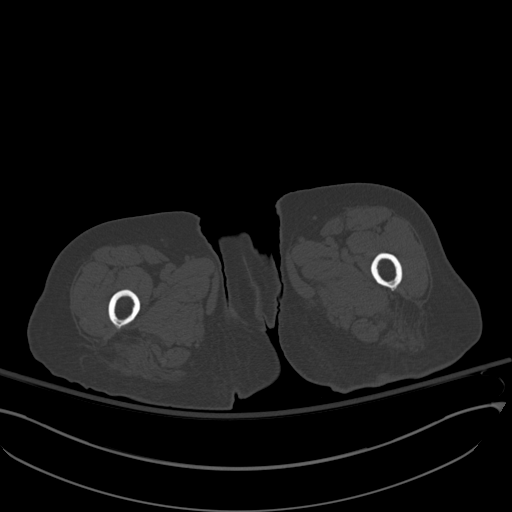
[im 21/163  soft-tissue]
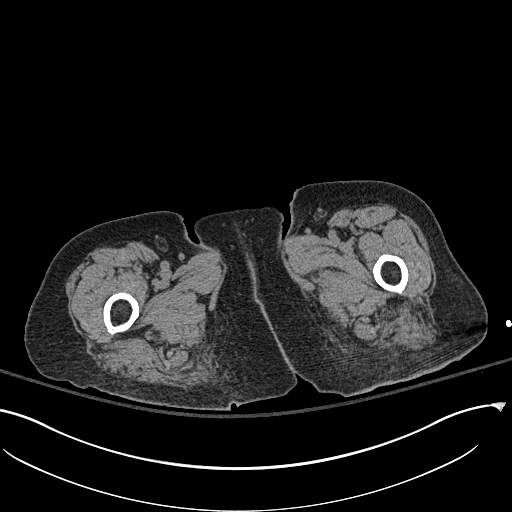
[im 32/163  soft-tissue]
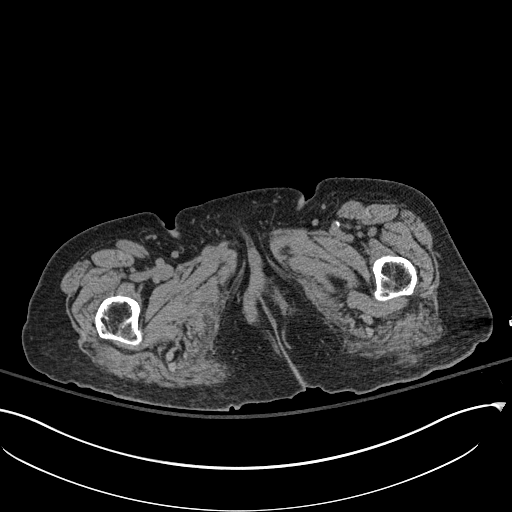
[im 48/163  soft-tissue]
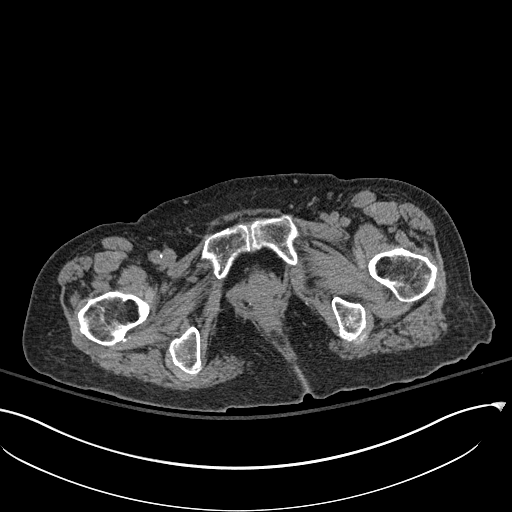
[im 58/163  soft-tissue]
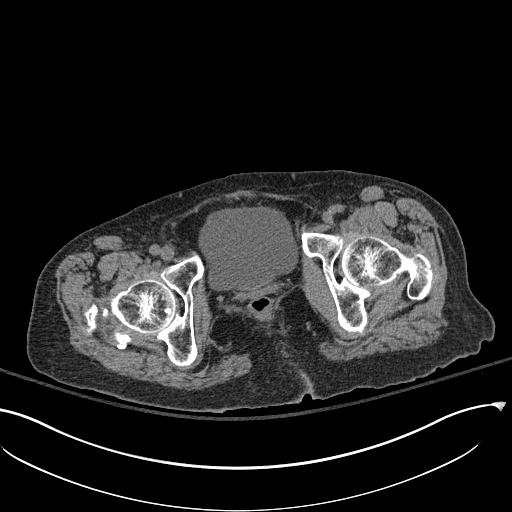
[im 68/163  soft-tissue]
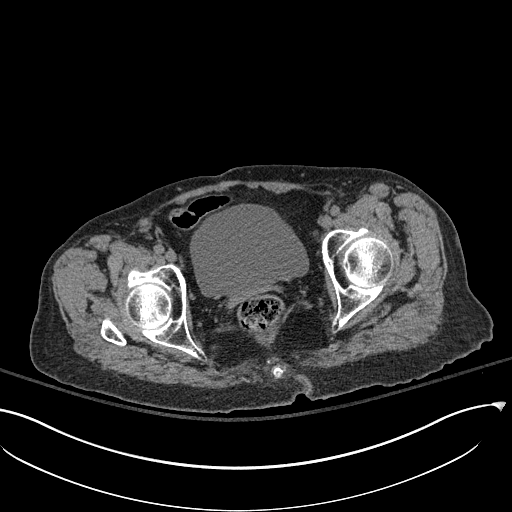
[im 84/163  soft-tissue]
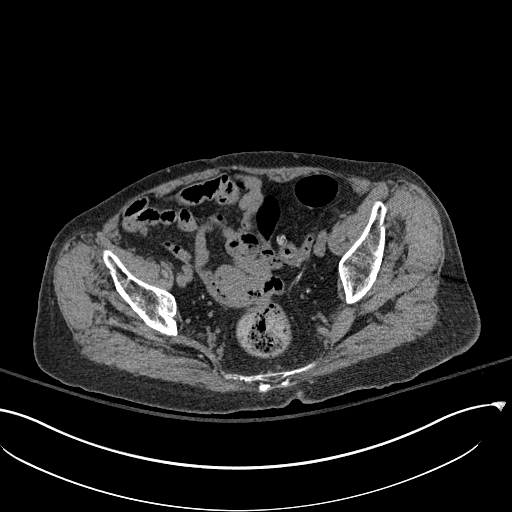
[im 95/163  soft-tissue]
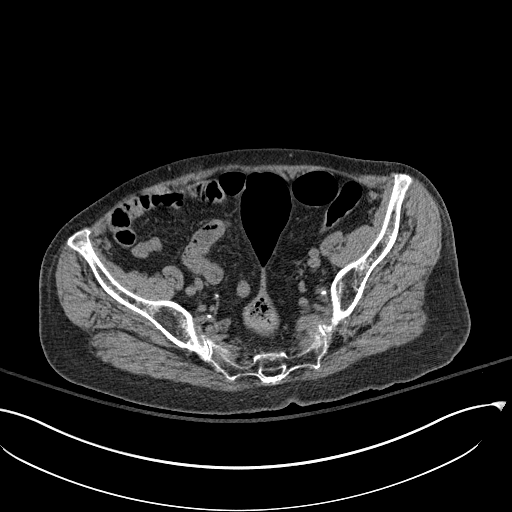
[im 105/163  soft-tissue]
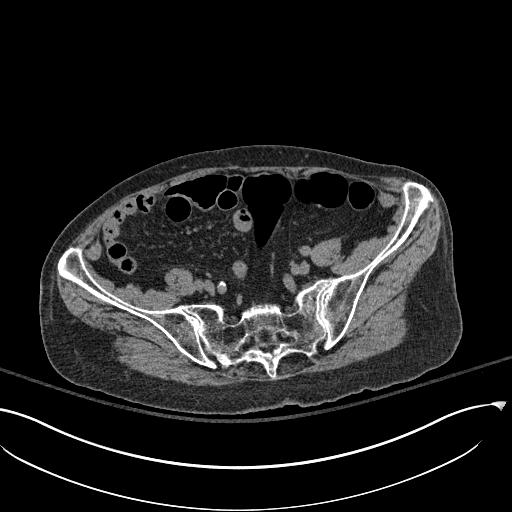
[im 105/163  bone]
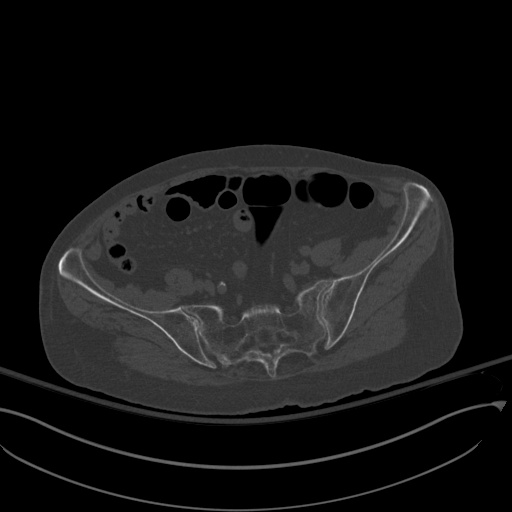
[im 115/163  soft-tissue]
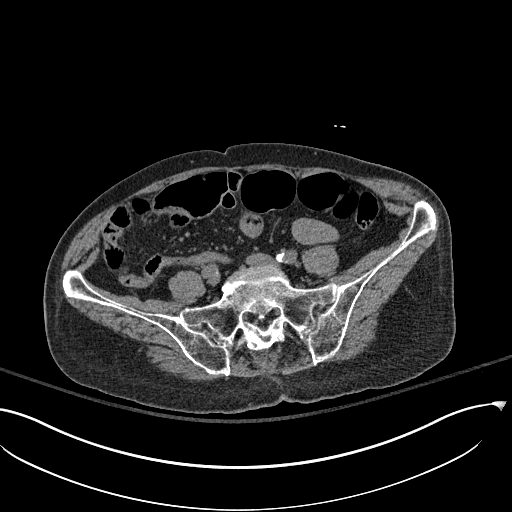
[im 131/163  soft-tissue]
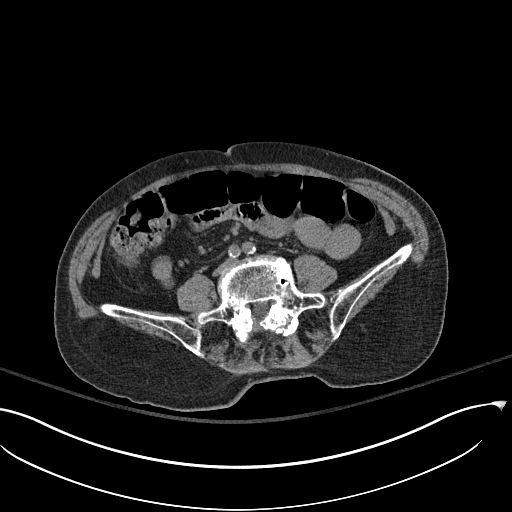
[im 142/163  soft-tissue]
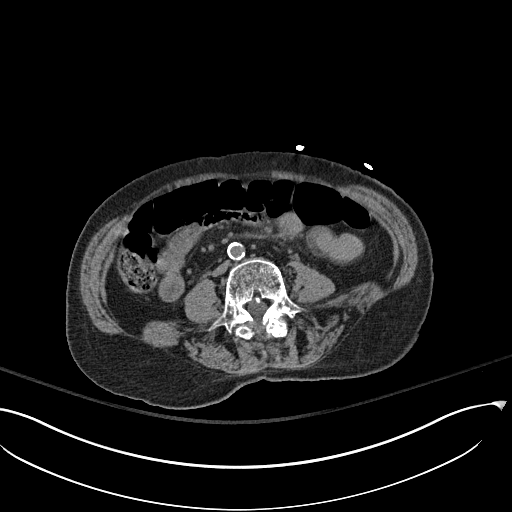
[im 152/163  soft-tissue]
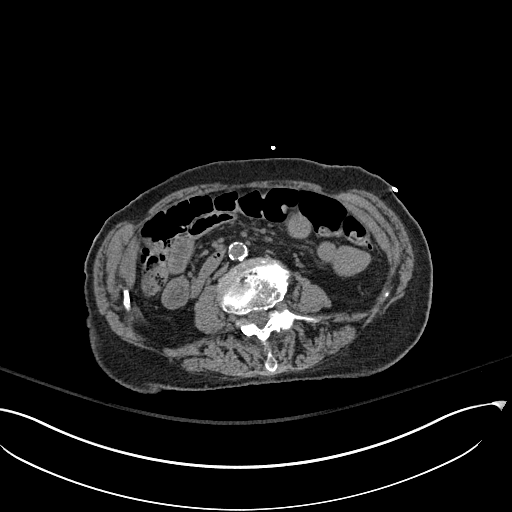

[Series 8: coronal st · coronal · 0.63mm/px · 3 of 130 slices shown]
[im 44/130  soft-tissue]
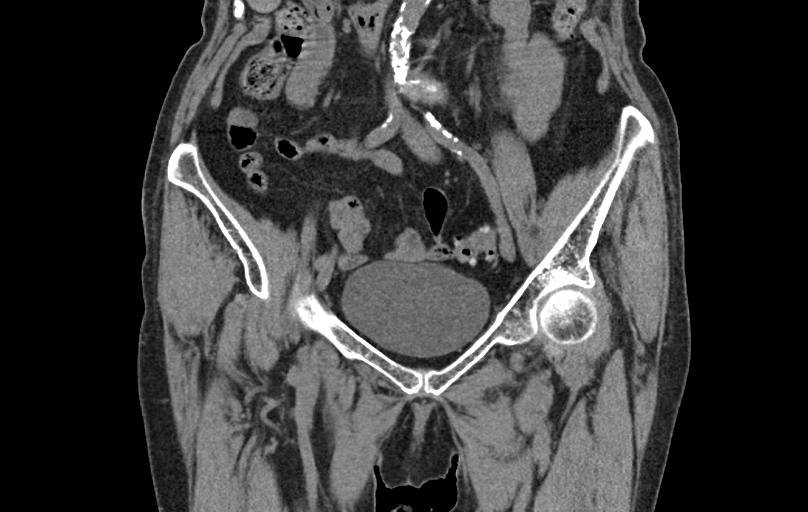
[im 58/130  soft-tissue]
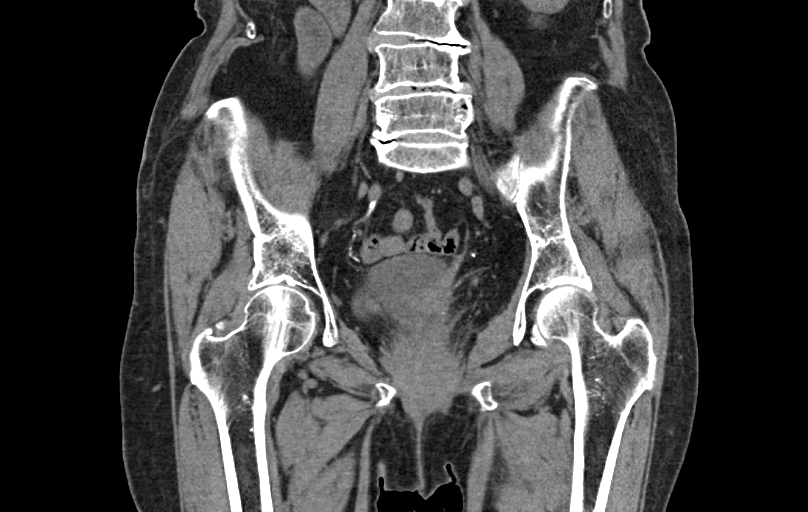
[im 72/130  soft-tissue]
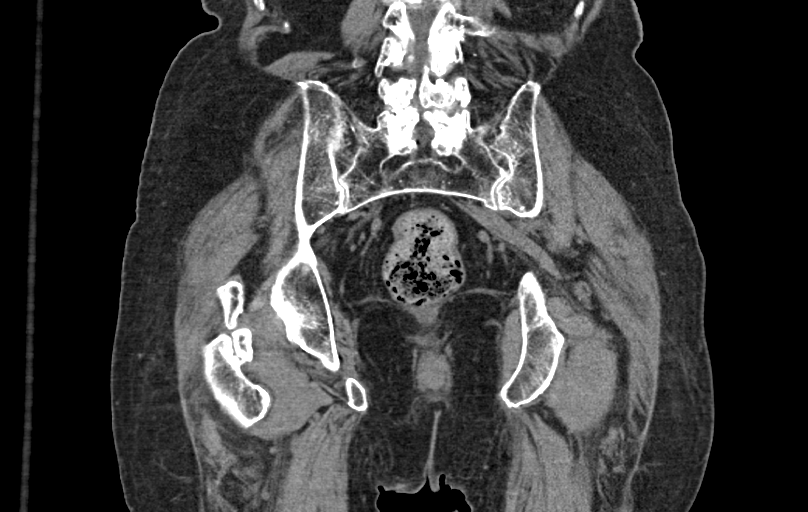

[16 of 46 positions shown; findings below may reference images not displayed]

FINDINGS: Urinary Tract:  No abnormality visualized.

Bowel: Colonic diverticulosis. Otherwise unremarkable visualized
pelvic bowel loops.

Vascular/Lymphatic: Mild aortic atherosclerosis. No lymphadenopathy.

Reproductive:  Prior hysterectomy.  No adnexal mass.

Other:  None.

Musculoskeletal: Acute, transverse, nondisplaced fracture of the
left puboacetabular junction. Acute, vertical, nondisplaced fracture
through the left inferior pubic ramus. No additional fracture seen.

Prior posterior decompression at L4-L5 and L5-S1. Severe lower
lumbar degenerative disc disease and facet arthropathy. Osteopenia.
IMPRESSION: 1. Acute, nondisplaced fractures of the left puboacetabular junction
and left inferior pubic ramus.
2.  Aortic atherosclerosis ([id number]).
